# Patient Record
Sex: Female | Born: 1985 | Race: White | Hispanic: No | Marital: Single | Smoking: Current every day smoker
Health system: Southern US, Community
[De-identification: ages and names within clinical notes are randomized; demographics above are authoritative.]

## PROBLEM LIST (undated history)

## (undated) DIAGNOSIS — F419 Anxiety disorder, unspecified: Secondary | ICD-10-CM

## (undated) DIAGNOSIS — F32A Depression, unspecified: Secondary | ICD-10-CM

## (undated) DIAGNOSIS — F329 Major depressive disorder, single episode, unspecified: Secondary | ICD-10-CM

## (undated) HISTORY — PX: ADENOIDECTOMY: SUR15

---

## 2009-04-08 ENCOUNTER — Emergency Department (HOSPITAL_BASED_OUTPATIENT_CLINIC_OR_DEPARTMENT_OTHER): Admission: EM | Admit: 2009-04-08 | Discharge: 2009-04-08 | Payer: Self-pay | Admitting: Emergency Medicine

## 2010-08-22 LAB — D-DIMER, QUANTITATIVE: D-Dimer, Quant: <0.22 ug/mL-FEU (ref 0.00–0.48)

## 2010-08-22 LAB — BASIC METABOLIC PANEL
BUN: 13 mg/dL (ref 6–23)
Creatinine, Ser: 0.7 mg/dL (ref 0.4–1.2)
GFR calc non Af Amer: >60 mL/min (ref >60)
Glucose, Bld: 85 mg/dL (ref 70–99)
Potassium: 4.9 mEq/L (ref 3.5–5.1)

## 2014-07-22 ENCOUNTER — Emergency Department (HOSPITAL_COMMUNITY): Payer: Self-pay

## 2014-07-22 ENCOUNTER — Emergency Department (HOSPITAL_COMMUNITY)
Admission: EM | Admit: 2014-07-22 | Discharge: 2014-07-22 | Disposition: A | Discharge status: Home / Self Care | Payer: Self-pay | Attending: Emergency Medicine | Admitting: Emergency Medicine

## 2014-07-22 ENCOUNTER — Encounter (HOSPITAL_COMMUNITY): Payer: Self-pay | Admitting: Emergency Medicine

## 2014-07-22 DIAGNOSIS — F419 Anxiety disorder, unspecified: Secondary | ICD-10-CM | POA: Insufficient documentation

## 2014-07-22 DIAGNOSIS — Z720 Tobacco use: Secondary | ICD-10-CM | POA: Insufficient documentation

## 2014-07-22 DIAGNOSIS — J209 Acute bronchitis, unspecified: Secondary | ICD-10-CM | POA: Insufficient documentation

## 2014-07-22 DIAGNOSIS — Z79899 Other long term (current) drug therapy: Secondary | ICD-10-CM | POA: Insufficient documentation

## 2014-07-22 DIAGNOSIS — F329 Major depressive disorder, single episode, unspecified: Secondary | ICD-10-CM | POA: Insufficient documentation

## 2014-07-22 HISTORY — DX: Major depressive disorder, single episode, unspecified: F32.9

## 2014-07-22 HISTORY — DX: Anxiety disorder, unspecified: F41.9

## 2014-07-22 HISTORY — DX: Depression, unspecified: F32.A

## 2014-07-22 MED ORDER — ALBUTEROL SULFATE HFA 108 (90 BASE) MCG/ACT IN AERS
2.0000 | INHALATION_SPRAY | RESPIRATORY_TRACT | Status: DC
  Administered 2014-07-22: 2 via RESPIRATORY_TRACT
  Filled 2014-07-22: qty 6.7

## 2014-07-22 MED ORDER — AZITHROMYCIN 250 MG PO TABS: ORAL_TABLET | ORAL | Status: DC

## 2014-07-22 MED ORDER — BENZONATATE 100 MG PO CAPS: 200.0000 mg | ORAL_CAPSULE | Freq: Three times a day (TID) | ORAL | Status: DC

## 2014-07-22 NOTE — ED Notes (Signed)
Patient complaining of cough and congestion x 6 days.

## 2014-07-22 NOTE — ED Notes (Addendum)
Alert, talking, sick since Sunday, Cough, fever,green sputum.  Nasal congestion

## 2014-07-22 NOTE — Discharge Instructions (Signed)
Acute Bronchitis °Bronchitis is inflammation of the airways that extend from the windpipe into the lungs (bronchi). The inflammation often causes mucus to develop. This leads to a cough, which is the most common symptom of bronchitis.  °In acute bronchitis, the condition usually develops suddenly and goes away over time, usually in a couple weeks. Smoking, allergies, and asthma can make bronchitis worse. Repeated episodes of bronchitis may cause further lung problems.  °CAUSES °Acute bronchitis is most often caused by the same virus that causes a cold. The virus can spread from person to person (contagious) through coughing, sneezing, and touching contaminated objects. °SIGNS AND SYMPTOMS  °· Cough.   °· Fever.   °· Coughing up mucus.   °· Body aches.   °· Chest congestion.   °· Chills.   °· Shortness of breath.   °· Sore throat.   °DIAGNOSIS  °Acute bronchitis is usually diagnosed through a physical exam. Your health care provider will also ask you questions about your medical history. Tests, such as chest X-rays, are sometimes done to rule out other conditions.  °TREATMENT  °Acute bronchitis usually goes away in a couple weeks. Oftentimes, no medical treatment is necessary. Medicines are sometimes given for relief of fever or cough. Antibiotic medicines are usually not needed but may be prescribed in certain situations. In some cases, an inhaler may be recommended to help reduce shortness of breath and control the cough. A cool mist vaporizer may also be used to help thin bronchial secretions and make it easier to clear the chest.  °HOME CARE INSTRUCTIONS °· Get plenty of rest.   °· Drink enough fluids to keep your urine clear or pale yellow (unless you have a medical condition that requires fluid restriction). Increasing fluids may help thin your respiratory secretions (sputum) and reduce chest congestion, and it will prevent dehydration.   °· Take medicines only as directed by your health care provider. °· If  you were prescribed an antibiotic medicine, finish it all even if you start to feel better. °· Avoid smoking and secondhand smoke. Exposure to cigarette smoke or irritating chemicals will make bronchitis worse. If you are a smoker, consider using nicotine gum or skin patches to help control withdrawal symptoms. Quitting smoking will help your lungs heal faster.   °· Reduce the chances of another bout of acute bronchitis by washing your hands frequently, avoiding people with cold symptoms, and trying not to touch your hands to your mouth, nose, or eyes.   °· Keep all follow-up visits as directed by your health care provider.   °SEEK MEDICAL CARE IF: °Your symptoms do not improve after 1 week of treatment.  °SEEK IMMEDIATE MEDICAL CARE IF: °· You develop an increased fever or chills.   °· You have chest pain.   °· You have severe shortness of breath. °· You have bloody sputum.   °· You develop dehydration. °· You faint or repeatedly feel like you are going to pass out. °· You develop repeated vomiting. °· You develop a severe headache. °MAKE SURE YOU:  °· Understand these instructions. °· Will watch your condition. °· Will get help right away if you are not doing well or get worse. °Document Released: 06/13/2004 Document Revised: 09/20/2013 Document Reviewed: 10/27/2012 °ExitCare® Patient Information ©2015 [employer]. This information is not intended to replace advice given to you by your health care provider. Make sure you discuss any questions you have with your health care provider. ° ° °Emergency Department Resource Guide °1) Find a Doctor and Pay Out of Pocket °Although you won't have to find out who is   covered by your insurance plan, it is a Alvira idea to ask around and get recommendations. You will then need to call the office and see if the doctor you have chosen will accept you as a new patient and what types of options they offer for patients who are self-pay. Some doctors offer discounts or will set up  payment plans for their patients who do not have insurance, but you will need to ask so you aren't surprised when you get to your appointment. ° °2) Contact Your Local Health Department °Not all health departments have doctors that can see patients for sick visits, but many do, so it is worth a call to see if yours does. If you don't know where your local health department is, you can check in your phone book. The CDC also has a tool to help you locate your state's health department, and many state websites also have listings of all of their local health departments. ° °3) Find a Walk-in Clinic °If your illness is not likely to be very severe or complicated, you may want to try a walk in clinic. These are popping up all over the country in pharmacies, drugstores, and shopping centers. They're usually staffed by nurse practitioners or physician assistants that have been trained to treat common illnesses and complaints. They're usually fairly quick and inexpensive. However, if you have serious medical issues or chronic medical problems, these are probably not your best option. ° °No Primary Care Doctor: °- Call Health Connect at  832-8000 - they can help you locate a primary care doctor that  accepts your insurance, provides certain services, etc. °- Physician Referral Service- 1-800-533-3463 ° °Chronic Pain Problems: °Organization         Address  Phone   Notes  °[facility]  ([phone]) [phone] Patients need to be referred by their primary care doctor.  ° °Medication Assistance: °Organization         Address  Phone   Notes  °[place] [facility] [address] °[place], [place] [address] (336) 641-8030 --Must be a resident of [place] °-- Must have NO insurance coverage whatsoever (no Medicaid/ Medicare, etc.) °-- The pt. MUST have a primary care doctor that directs their care regularly and follows them in the community °  °[facility]  (866) 331-1348   °[employer]  (888) 892-1162   ° °Agencies that provide inexpensive medical care: °Organization         Address  Phone   Notes  °[facility]  (336) 832-8035   ° Internal Medicine    (336) 832-7272   °[facility] [address] °[place], [place] [address] (336) 832-4777   °[facility] [address], °[place] (336) 271-4999   °Planned Parenthood    (336) 373-0678   °[facility]    (336) 272-1050   °[facility] ° [address], [place] Phone:  (336) 832-4444, Fax:  (336) 832-4440 Hours of Operation:  9 am - 6 pm, M-F.  Also accepts Medicaid/Medicare and self-pay.  °[facility] ° [address], [place] Phone: (336) [phone], Fax: (336) [phone]. Hours of Operation:  8:30 am - 5:30 pm, M-F.  Also accepts Medicaid and self-pay.  °[facility] [place] [address], [place] Phone: (336) [phone]   °[facility] [address], [place], [place] (336)723-1848, Ext.   123 Mondays & Thursdays: 7-9 AM.  First 15 patients are seen on a first come, first serve basis.    [place] Providers:  Organization         Address  Phone   Notes  [facility] [address], [place] [phone] Also accepts self-pay patients.  [facility] [address], [place]  [phone]   [place], [address], [place] [phone]   [facility] [address] [place] [phone]   Lucianne Lei [address], [place]   [phone] Only accepts [place] Access [place] patients after they have their name applied to their card.   Self-Pay (no insurance) in [facility]:  Organization         Address  Phone   Notes  Sickle Cell Patients, [facility] [place]  [phone]   [facility] [place] [phone]   [facility] [place]  [place], [place], [place] [phone]   [facility]/Dr. Osei-Bonsu  [address], [place] or [address], [place] [phone] Phone number for both [place] and [place] locations is the same.  [facility] [address] [place] [phone]   [facility] [address], [place] or [address] [phone]   [facility] [address], [place] [phone], phone; [phone], fax Sees patients 1st and 3rd Saturday of every month.  Must not qualify for public or private insurance (i.e. Medicaid, Medicare, [place] Health Choice, Veterans' Benefits)  Household income should be no more than 200% of the poverty level The clinic cannot treat you if you are pregnant or think you are pregnant  Sexually transmitted diseases are not treated at the clinic.    Dental Care: Organization         Address  Phone  Notes  [facility] [place] [phone] Accepts children up to age 24 who are enrolled in [place] or [place]; pregnant women with a Medicaid card; and children who have applied for Medicaid or [place] Health Choice, but were declined, whose parents can pay a reduced fee at time of service.  [facility]  [address], [place] [phone] Accepts children up to age 75 who are enrolled in [place] or [place]; pregnant women with a Medicaid card; and children who have applied for Medicaid or [place] Health Choice, but were declined, whose parents can pay a reduced fee at time of service.  [facility]  [place] [phone] Patients  are seen by appointment only. Walk-ins are not accepted. [place] will see patients 73 years of age and older. Monday - Tuesday (8am-5pm) Most Wednesdays (8:30-5pm) $30 per visit, cash only  [facility]  [address], [facility] [phone] Patients are seen by appointment only. Walk-ins are not accepted. [place] will see patients 100 years of age and older. One Wednesday Evening (Monthly: Volunteer Based).  $30 per visit, cash only  [place]  [phone] for  adults; Children under age 4, call Graduate Pediatric Dentistry at [phone]. Children aged 4-14, please call [phone] to request a pediatric application. ° Dental services are provided in all areas of dental care including fillings, crowns and bridges, complete and partial dentures, implants, gum treatment, root canals, and extractions. Preventive care is also provided. Treatment is provided to both adults and children. °Patients are selected via a lottery and there is often a waiting list. °  °[facility] [address], °[place] ° (336) 763-8833 [email or web address] °  °[facility] [address], [place], [place] (336)723-1848, Ext. 123 Second and Fourth Thursday of each month, opens at 6:30 AM; Clinic ends at 9 AM.  Patients are seen on a first-come first-served basis, and a limited number are seen during each clinic.  ° °[facility] ° [address], [place], [place] (336) [phone]   Eligibility Requirements °You must have lived in [place], [place], or [place] counties for at least the last three months. °  You cannot be eligible for state or federal sponsored healthcare insurance, including Veterans Administration, Medicaid, or Medicare. °  You generally cannot be eligible for healthcare insurance through your employer.  °  How to apply: °Eligibility screenings are held every Tuesday and Wednesday afternoon from 1:00 pm until  4:00 pm. You do not need an appointment for the interview!  °[facility] [address], [place], [place] [phone]   °[place] [facility]  336-342-8273   °[place]  336-703-3100   °[facility]  336-570-6415   ° °Behavioral Health Resources in the Community: °Intensive Outpatient Programs °Organization         Address  Phone  Notes  °[facility] [address], [place], [place] [phone]   °[facility] [address], [place], [place] [phone]   °[facility] [address], [place], [place] ° 336-882-2125   °[facility] [address],  °[place] 1-800-853-5163 or 336-641-4981   °Substance Abuse Resources °Organization         Address  Phone  Notes  °[facility]  336-882-2125   °[facility]  336-784-9470   °[facility]  336-285-9073   °[facility]  336-845-3988   °Residential & Outpatient Substance Abuse Program  1-800-659-3381   °Psychological Services °Organization         Address  Phone  Notes  °[facility]  336- 832-9600   °[facility]  336- 378-7881   °[facility] [address], [place] 1-800-853-5163 or 336-641-4981   ° °Mobile Crisis Teams °Organization         Address  Phone  Notes  °[facility], Mobile Crisis Care Unit  1-877-626-1772   °Assertive °[facility]otherapeutic Services ° [address] [place], [place] 336-834-9664   °Sharon DeEsch [address] °[place] 336-554-5454   ° °Self-Help/Support Groups °Organization         Address  Phone             Notes  °[facility] - variety of support groups  336- 373-1402 Call for more information  °Narcotics Anonymous (NA), [facility] [address], °[place]  2 meetings at this location  ° °Residential Treatment Programs °Organization          Address  Phone  Notes  °[facility]   [facility] [address],    [place]  [phone]   [facility]  [address], [place] [phone], [place], [place] [phone]   [facility] [address], [place] [phone] Admissions: 8am-3pm M-F  [facility] [address]    [place], [place] [phone]   [facility] [address], [place], [place] [phone]   [facility] [address]  [place], [place] [phone]   [facility] Intensive Outpatient [address], [place], [place] [phone]   [facility] ([facility] [address]  [place], [place] [phone] or [phone]   [facility] ([facility]) [address] [place], [place] [phone] Accepts Medicaid  [facility] [address]  [place] [phone] Substance Abuse/Addiction Treatment   [facility] Resources Organization         Address  Phone  Notes  [facility]  ([phone]) [phone]   Angie FavaJulie Brannon, PhD [address], [address] [place], [place]   [phone](336) [phone] or [phone](336) [phone]   [facility]   [address] [place], [place] [phone](336) [phone]   [facility] [address], [place], [place] [phone](336) [phone] Insurance/Medicaid/sponsorship through [facility] [address] [place] [address]                                    [place], [place] [phone](336) [phone] Therapy/tele-psych/case  [facility] [address]   [place], [place] [phone]) [phone]    Dr. Lolly MustacheArfeen  [phone]) [phone]   [facility] [place]  [employer] [facility] 1) [address],  2) [address], [place] 3)  [address], [place] [phone](336) [phone]  [phone](336) [phone]   [facility] Child Abuse Hot[facility] [phone](336) [phone] or [phone](336) [phone] (After Hours)         Use the medicines as prescribed.   Take your inhaler using the spacer as instructed - 2 puffs every 4 hours if you are wheezing or coughing.  Get rechecked if you develop any worsening symptoms such as shortness of breath, fevers.  Consider smoking cessation.

## 2014-07-24 NOTE — ED Provider Notes (Signed)
CSN: [id number]     Arrival date & time 07/22/14  [id number] History   First MD Initiated Contact with Patient 07/22/14 1106     Chief Complaint  Patient presents with  . Cough  . Nasal Congestion     (Consider location/radiation/quality/duration/timing/severity/associated sxs/prior Treatment) Patient is a 29 y.o. female presenting with cough. The history is provided by the patient.  Cough Cough characteristics:  Productive Sputum characteristics:  Green Severity:  Moderate Onset quality:  Gradual Duration:  6 days Timing:  Intermittent Progression:  Worsening Chronicity:  New Smoker: yes   Context: upper respiratory infection   Relieved by:  Nothing Worsened by:  Nothing tried Ineffective treatments: mucinex and alka seltzer plus. Associated symptoms: chest pain, chills, fever, rhinorrhea, shortness of breath and wheezing   Associated symptoms: no sinus congestion and no sore throat   Associated symptoms comment:  Burning chest pain with cough only. Fever subjective.  Chest pain:    Quality:  Burning   Severity:  Moderate   Timing:  Intermittent   Past Medical History  Diagnosis Date  . Depression   . Anxiety    History reviewed. No pertinent past surgical history. History reviewed. No pertinent family history. History  Substance Use Topics  . Smoking status: Current Every Day Smoker  . Smokeless tobacco: Not on file  . Alcohol Use: No   OB History    No data available     Review of Systems  Constitutional: Positive for fever and chills.  HENT: Positive for rhinorrhea. Negative for congestion and sore throat.   Respiratory: Positive for cough, shortness of breath and wheezing.   Cardiovascular: Positive for chest pain. Negative for palpitations and leg swelling.  Gastrointestinal: Negative for abdominal pain.  Genitourinary: Negative.       Allergies  Review of patient's allergies indicates no known allergies.  Home Medications   Prior to Admission  medications   Medication Sig Start Date End Date Taking? Authorizing Provider  citalopram (CELEXA) 20 MG tablet Take 20 mg by mouth daily.   Yes Historical Provider, MD  guaiFENesin (MUCINEX) 600 MG 12 hr tablet Take 1,200 mg by mouth 2 (two) times daily.   Yes Historical Provider, MD  hydrOXYzine (ATARAX/VISTARIL) 50 MG tablet Take 50 mg by mouth 3 (three) times daily.   Yes Historical Provider, MD  Phenyleph-Doxylamine-DM-APAP (ALKA SELTZER PLUS PO) Take 2 capsules by mouth every 6 (six) hours as needed (congestion/cough).   Yes Historical Provider, MD  traZODone (DESYREL) 50 MG tablet Take 50 mg by mouth at bedtime.   Yes Historical Provider, MD  azithromycin (ZITHROMAX Z-PAK) 250 MG tablet Take 2 tablets by mouth on day one followed by one tablet daily for 4 days. 07/22/14   Burgess AmorJulie Lajeana Strough, PA-C  benzonatate (TESSALON) 100 MG capsule Take 2 capsules (200 mg total) by mouth every 8 (eight) hours. 07/22/14   Burgess AmorJulie Levii [place]field, PA-C   BP 136/75 mmHg  Pulse 78  Temp(Src) 98.5 F (36.9 C) (Oral)  Resp 16  Ht 5\' 6"  (1.676 m)  Wt 200 lb (90.719 kg)  BMI 32.30 kg/m2  SpO2 96%  LMP 07/15/2014 Physical Exam  Constitutional: She is oriented to person, place, and time. She appears well-developed and well-nourished.  HENT:  Head: Normocephalic and atraumatic.  Right Ear: Tympanic membrane and ear canal normal.  Left Ear: Tympanic membrane and ear canal normal.  Nose: Rhinorrhea present. No mucosal edema.  Mouth/Throat: Uvula is midline, oropharynx is clear and moist and mucous membranes are normal.  No oropharyngeal exudate, posterior oropharyngeal edema, posterior oropharyngeal erythema or tonsillar abscesses.  Eyes: Conjunctivae are normal.  Cardiovascular: Normal rate and normal heart sounds.   Pulmonary/Chest: Effort normal. No respiratory distress. She has no decreased breath sounds. She has no wheezes. She has no rhonchi. She has no rales.  Coarse breath sounds.  Abdominal: Soft. There is no tenderness.   Musculoskeletal: Normal range of motion.  Neurological: She is alert and oriented to person, place, and time.  Skin: Skin is warm and dry. No rash noted.  Psychiatric: She has a normal mood and affect.    ED Course  Procedures (including critical care time) Labs Review Labs Reviewed - No data to display  Imaging Review Dg Chest 2 View  07/22/2014   CLINICAL DATA:  Cough and fever for 5 days  EXAM: CHEST  2 VIEW  COMPARISON:  None.  FINDINGS: Lungs are clear. Heart size and pulmonary vascularity are normal. No adenopathy. No bone lesions.  IMPRESSION: No edema or consolidation.   Electronically Signed   By: Bretta Bang III M.D.   On: 07/22/2014 10:09     EKG Interpretation None      MDM   Final diagnoses:  Acute bronchitis, unspecified organism    Patients labs and/or radiological studies were reviewed and considered during the medical decision making and disposition process.  Results were also discussed with patient. Pt placed on z pack, tessalon, given albuterol mdi. Encouraged rest, motrin or tylenol prn fever, advised smoking cessation. Recheck for any worsened sx.  The patient appears reasonably screened and/or stabilized for discharge and I doubt any other medical condition or other [facility] requiring further screening, evaluation, or treatment in the ED at this time prior to discharge.     Burgess Amor, PA-C 07/24/14 9604  Donnetta Hutching, MD 07/30/14 [phone]

## 2014-10-19 ENCOUNTER — Encounter (HOSPITAL_COMMUNITY): Payer: Self-pay | Admitting: *Deleted

## 2014-10-19 ENCOUNTER — Emergency Department (HOSPITAL_COMMUNITY)
Admission: EM | Admit: 2014-10-19 | Discharge: 2014-10-20 | Disposition: A | Discharge status: Home / Self Care | Payer: Self-pay | Attending: Emergency Medicine | Admitting: Emergency Medicine

## 2014-10-19 DIAGNOSIS — K029 Dental caries, unspecified: Secondary | ICD-10-CM | POA: Insufficient documentation

## 2014-10-19 DIAGNOSIS — K0889 Other specified disorders of teeth and supporting structures: Secondary | ICD-10-CM

## 2014-10-19 DIAGNOSIS — Z79899 Other long term (current) drug therapy: Secondary | ICD-10-CM | POA: Insufficient documentation

## 2014-10-19 DIAGNOSIS — K088 Other specified disorders of teeth and supporting structures: Secondary | ICD-10-CM | POA: Insufficient documentation

## 2014-10-19 DIAGNOSIS — Z792 Long term (current) use of antibiotics: Secondary | ICD-10-CM | POA: Insufficient documentation

## 2014-10-19 DIAGNOSIS — F329 Major depressive disorder, single episode, unspecified: Secondary | ICD-10-CM | POA: Insufficient documentation

## 2014-10-19 DIAGNOSIS — F419 Anxiety disorder, unspecified: Secondary | ICD-10-CM | POA: Insufficient documentation

## 2014-10-19 MED ORDER — AMOXICILLIN 250 MG PO CAPS
500.0000 mg | ORAL_CAPSULE | Freq: Once | ORAL | Status: AC
  Administered 2014-10-20: 500 mg via ORAL
  Filled 2014-10-19: qty 2

## 2014-10-19 MED ORDER — ACETAMINOPHEN-CODEINE #3 300-30 MG PO TABS: 2.0000 | ORAL_TABLET | Freq: Once | ORAL | Status: DC

## 2014-10-19 MED ORDER — ACETAMINOPHEN 325 MG PO TABS
650.0000 mg | ORAL_TABLET | Freq: Once | ORAL | Status: AC
  Administered 2014-10-20: 650 mg via ORAL
  Filled 2014-10-19: qty 2

## 2014-10-19 MED ORDER — AMOXICILLIN 500 MG PO CAPS: 500.0000 mg | ORAL_CAPSULE | Freq: Three times a day (TID) | ORAL | Status: DC

## 2014-10-19 MED ORDER — IBUPROFEN 800 MG PO TABS: 800.0000 mg | ORAL_TABLET | Freq: Three times a day (TID) | ORAL | Status: DC

## 2014-10-19 MED ORDER — IBUPROFEN 800 MG PO TABS
800.0000 mg | ORAL_TABLET | Freq: Once | ORAL | Status: AC
  Administered 2014-10-20: 800 mg via ORAL
  Filled 2014-10-19: qty 1

## 2014-10-19 NOTE — ED Notes (Signed)
Pt co upper left jaw pain from cracked tooth, pt has dental appt in 2 weeks but states needs help tonight.

## 2014-10-19 NOTE — Discharge Instructions (Signed)
Dental Pain  Toothache is pain in or around a tooth. It may get worse with chewing or with cold or heat.   HOME CARE  · Your dentist may use a numbing medicine during treatment. If so, you may need to avoid eating until the medicine wears off. Ask your dentist about this.  · Only take medicine as told by your dentist or doctor.  · Avoid chewing food near the painful tooth until after all treatment is done. Ask your dentist about this.  GET HELP RIGHT AWAY IF:   · The problem gets worse or new problems appear.  · You have a fever.  · There is redness and puffiness (swelling) of the face, jaw, or neck.  · You cannot open your mouth.  · There is pain in the jaw.  · There is very bad pain that is not helped by medicine.  MAKE SURE YOU:   · Understand these instructions.  · Will watch your condition.  · Will get help right away if you are not doing well or get worse.  Document Released: 10/23/2007 Document Revised: 07/29/2011 Document Reviewed: 10/23/2007  ExitCare® Patient Information ©2015 [employer]. This information is not intended to replace advice given to you by your health care provider. Make sure you discuss any questions you have with your health care provider.

## 2014-10-19 NOTE — ED Provider Notes (Signed)
CSN: [id number]     Arrival date & time 10/19/14  2238 History   First MD Initiated Contact with Patient 10/19/14 2310     Chief Complaint  Patient presents with  . Dental Pain     (Consider location/radiation/quality/duration/timing/severity/associated sxs/prior Treatment) Patient is a 29 y.o. female presenting with tooth pain. The history is provided by the patient.  Dental Pain Location:  Upper Quality:  Throbbing and shooting Severity:  Moderate Onset quality:  Gradual Timing:  Intermittent Progression:  Worsening Context: dental caries   Relieved by:  Nothing Worsened by:  Cold food/drink Associated symptoms: facial pain   Associated symptoms: no drooling and no fever   Risk factors: smoking   Risk factors: no diabetes     Past Medical History  Diagnosis Date  . Depression   . Anxiety    History reviewed. No pertinent past surgical history. History reviewed. No pertinent family history. History  Substance Use Topics  . Smoking status: Current Every Day Smoker  . Smokeless tobacco: Not on file  . Alcohol Use: No   OB History    No data available     Review of Systems  Constitutional: Negative for fever.  HENT: Positive for dental problem. Negative for drooling.   Psychiatric/Behavioral: The patient is nervous/anxious.   All other systems reviewed and are negative.     Allergies  Review of patient's allergies indicates no known allergies.  Home Medications   Prior to Admission medications   Medication Sig Start Date End Date Taking? Authorizing Provider  azithromycin (ZITHROMAX Z-PAK) 250 MG tablet Take 2 tablets by mouth on day one followed by one tablet daily for 4 days. 07/22/14   Burgess Amor, PA-C  benzonatate (TESSALON) 100 MG capsule Take 2 capsules (200 mg total) by mouth every 8 (eight) hours. 07/22/14   Burgess Amor, PA-C  citalopram (CELEXA) 20 MG tablet Take 20 mg by mouth daily.    Historical Provider, MD  guaiFENesin (MUCINEX) 600 MG 12 hr tablet  Take 1,200 mg by mouth 2 (two) times daily.    Historical Provider, MD  hydrOXYzine (ATARAX/VISTARIL) 50 MG tablet Take 50 mg by mouth 3 (three) times daily.    Historical Provider, MD  Phenyleph-Doxylamine-DM-APAP (ALKA SELTZER PLUS PO) Take 2 capsules by mouth every 6 (six) hours as needed (congestion/cough).    Historical Provider, MD  traZODone (DESYREL) 50 MG tablet Take 50 mg by mouth at bedtime.    Historical Provider, MD   BP 148/84 mmHg  Pulse 66  Temp(Src) 98 F (36.7 C) (Oral)  Resp 17  Ht  (1.676 m)  Wt 200 lb (90.719 kg)  BMI 32.30 kg/m2  SpO2 97%  LMP 09/07/2014 Physical Exam  Constitutional: She is oriented to person, place, and time. She appears well-developed and well-nourished.  Non-toxic appearance.  HENT:  Head: Normocephalic.  Right Ear: Tympanic membrane and external ear normal.  Left Ear: Tympanic membrane and external ear normal.  Mouth/Throat: Uvula is midline. No trismus in the jaw. Abnormal dentition. Dental caries present. No dental abscesses.    Eyes: EOM and lids are normal. Pupils are equal, round, and reactive to light.  Neck: Normal range of motion. Neck supple. Carotid bruit is not present.  Cardiovascular: Normal rate, regular rhythm, normal heart sounds, intact distal pulses and normal pulses.   Pulmonary/Chest: Breath sounds normal. No respiratory distress.  Abdominal: Soft. Bowel sounds are normal. There is no tenderness. There is no guarding.  Musculoskeletal: Normal range of motion.  Lymphadenopathy:       Head (right side): No submandibular adenopathy present.       Head (left side): No submandibular adenopathy present.    She has no cervical adenopathy.  Neurological: She is alert and oriented to person, place, and time. She has normal strength. No cranial nerve deficit or sensory deficit.  Skin: Skin is warm and dry.  Psychiatric: She has a normal mood and affect. Her speech is normal.  Nursing note and vitals reviewed.   ED  Course  Procedures (including critical care time) Labs Review Labs Reviewed - No data to display  Imaging Review No results found.   EKG Interpretation None      MDM Vital signs stable. No abscess. No evidence for Ludwig's angina. Rx for amoxil and ibuprofen given to the patient.   Final diagnoses:  None    **I have reviewed nursing notes, vital signs, and all appropriate lab and imaging results for this patient.Ivery Quale*    Maelle Sheaffer, PA-C 10/20/14 0002  Devoria AlbeIva Knapp, MD 10/20/14 [phone]

## 2014-11-28 ENCOUNTER — Emergency Department (HOSPITAL_COMMUNITY): Payer: Self-pay

## 2014-11-28 ENCOUNTER — Emergency Department (HOSPITAL_COMMUNITY)
Admission: EM | Admit: 2014-11-28 | Discharge: 2014-11-28 | Disposition: A | Discharge status: Home / Self Care | Payer: Worker's Compensation | Attending: Emergency Medicine | Admitting: Emergency Medicine

## 2014-11-28 ENCOUNTER — Encounter (HOSPITAL_COMMUNITY): Payer: Self-pay | Admitting: Emergency Medicine

## 2014-11-28 DIAGNOSIS — F419 Anxiety disorder, unspecified: Secondary | ICD-10-CM | POA: Insufficient documentation

## 2014-11-28 DIAGNOSIS — X58XXXA Exposure to other specified factors, initial encounter: Secondary | ICD-10-CM | POA: Insufficient documentation

## 2014-11-28 DIAGNOSIS — Y9289 Other specified places as the place of occurrence of the external cause: Secondary | ICD-10-CM | POA: Insufficient documentation

## 2014-11-28 DIAGNOSIS — Y9389 Activity, other specified: Secondary | ICD-10-CM | POA: Insufficient documentation

## 2014-11-28 DIAGNOSIS — F329 Major depressive disorder, single episode, unspecified: Secondary | ICD-10-CM | POA: Insufficient documentation

## 2014-11-28 DIAGNOSIS — S63502A Unspecified sprain of left wrist, initial encounter: Secondary | ICD-10-CM

## 2014-11-28 DIAGNOSIS — Y990 Civilian activity done for income or pay: Secondary | ICD-10-CM | POA: Insufficient documentation

## 2014-11-28 DIAGNOSIS — Z79899 Other long term (current) drug therapy: Secondary | ICD-10-CM | POA: Insufficient documentation

## 2014-11-28 DIAGNOSIS — Z720 Tobacco use: Secondary | ICD-10-CM | POA: Insufficient documentation

## 2014-11-28 MED ORDER — IBUPROFEN 800 MG PO TABS
800.0000 mg | ORAL_TABLET | Freq: Once | ORAL | Status: AC
  Administered 2014-11-28: 800 mg via ORAL
  Filled 2014-11-28: qty 1

## 2014-11-28 MED ORDER — IBUPROFEN 800 MG PO TABS: 800.0000 mg | ORAL_TABLET | Freq: Three times a day (TID) | ORAL | Status: DC

## 2014-11-28 NOTE — ED Notes (Signed)
Pt states that she twisted her left wrist at work.  Workman's comp injury

## 2014-11-28 NOTE — ED Provider Notes (Signed)
CSN: [id number]     Arrival date & time 11/28/14  1805 History   This chart was scribed for non-physician practitioner Pauline Ausammy Chelse Matas, PA-C working with Glynn OctaveStephen Rancour, MD by Murriel HopperAlec Bankhead, ED Scribe. This patient was seen in room APFT22/APFT22 and the patient's care was started at 7:12 PM.   Chief Complaint  Patient presents with  . Wrist Pain      Patient is a 29 y.o. female presenting with wrist pain. The history is provided by the patient. No language interpreter was used.  Wrist Pain This is a new problem. The current episode started 3 to 5 hours ago. The problem occurs constantly. The problem has not changed since onset.The symptoms are aggravated by stress and exertion. Nothing relieves the symptoms. She has tried nothing for the symptoms.     HPI Comments: Jamie Avery is a 29 y.o. female who presents to the Emergency Department complaining of constant left wrist pain with associated radiating pain through her left elbow that has been present since 4 hours PTA when pt injured her wrist at work. Pt states that she was moving boxes, and when she turned her wrist to place a box on a shelf that she felt a sharp pain to the lateral aspect of it. Pt notes now that any movement of her wrist causes pain, and states she has kept her wrist in a stable position ever since incident occurred. She denies numbness or weakness of the fingers, swelling or open wounds.  She has not tried any therapies prior to arrival  Past Medical History  Diagnosis Date  . Depression   . Anxiety    History reviewed. No pertinent past surgical history. History reviewed. No pertinent family history. History  Substance Use Topics  . Smoking status: Current Every Day Smoker  . Smokeless tobacco: Not on file  . Alcohol Use: No   OB History    No data available     Review of Systems  Musculoskeletal: Positive for myalgias and arthralgias. Negative for joint swelling.  Neurological: Negative for weakness and  numbness.  All other systems reviewed and are negative.     Allergies  Review of patient's allergies indicates no known allergies.  Home Medications   Prior to Admission medications   Medication Sig Start Date End Date Taking? Authorizing Provider  amoxicillin (AMOXIL) 500 MG capsule Take 1 capsule (500 mg total) by mouth 3 (three) times daily. 10/19/14   Ivery QualeHobson Bryant, PA-C  azithromycin (ZITHROMAX Z-PAK) 250 MG tablet Take 2 tablets by mouth on day one followed by one tablet daily for 4 days. 07/22/14   Burgess AmorJulie Idol, PA-C  benzonatate (TESSALON) 100 MG capsule Take 2 capsules (200 mg total) by mouth every 8 (eight) hours. 07/22/14   Burgess AmorJulie Idol, PA-C  citalopram (CELEXA) 20 MG tablet Take 20 mg by mouth daily.    Historical Provider, MD  guaiFENesin (MUCINEX) 600 MG 12 hr tablet Take 1,200 mg by mouth 2 (two) times daily.    Historical Provider, MD  hydrOXYzine (ATARAX/VISTARIL) 50 MG tablet Take 50 mg by mouth 3 (three) times daily.    Historical Provider, MD  ibuprofen (ADVIL,MOTRIN) 800 MG tablet Take 1 tablet (800 mg total) by mouth 3 (three) times daily. 10/19/14   Ivery QualeHobson Bryant, PA-C  Phenyleph-Doxylamine-DM-APAP (ALKA SELTZER PLUS PO) Take 2 capsules by mouth every 6 (six) hours as needed (congestion/cough).    Historical Provider, MD  traZODone (DESYREL) 50 MG tablet Take 50 mg by mouth at bedtime.  Historical Provider, MD   BP 143/86 mmHg  Pulse 75  Temp(Src) 98.5 F (36.9 C) (Oral)  Resp 24  Ht  (1.676 m)  Wt 211 lb (95.709 kg)  BMI 34.07 kg/m2  SpO2 98% Physical Exam  Constitutional: She is oriented to person, place, and time. She appears well-developed and well-nourished.  HENT:  Head: Normocephalic and atraumatic.  Cardiovascular: Normal rate.   Pulmonary/Chest: Effort normal.  Musculoskeletal: She exhibits no edema.  Tenderness at distal left wrist No edema No bony deformity Sensation and radial pulse intact   Neurological: She is alert and oriented to person,  place, and time.  Skin: Skin is warm and dry.  Psychiatric: She has a normal mood and affect.  Nursing note and vitals reviewed.   ED Course  Procedures (including critical care time)  DIAGNOSTIC STUDIES: Oxygen Saturation is 98% on room air, normal by my interpretation.    COORDINATION OF CARE: 7:17 PM Discussed treatment plan with pt at bedside and pt agreed to plan.   Labs Review Labs Reviewed - No data to display  Imaging Review Dg Wrist Complete Left  11/28/2014   CLINICAL DATA:  29 year old female with trauma CT left wrist  EXAM: LEFT WRIST - COMPLETE 3+ VIEW  COMPARISON:  None.  FINDINGS: There is no evidence of fracture or dislocation. There is no evidence of arthropathy or other focal bone abnormality. Soft tissues are unremarkable.  IMPRESSION: No fracture or dislocation.   Electronically Signed   By: Elgie Collard M.D.   On: 11/28/2014 18:32     EKG Interpretation None      MDM   Final diagnoses:  Wrist sprain, left, initial encounter    Sprain of wrist, XR neg for fx.  NV intact.  Wrist splint applied, pain improved.  Agrees to ortho f/u in one week if needed  I personally performed the services described in this documentation, which was scribed in my presence. The recorded information has been reviewed and is accurate.    Pauline Aus, PA-C 12/01/14 1631  Glynn Octave, MD 12/02/14 [phone]

## 2014-11-28 NOTE — Discharge Instructions (Signed)
Ligament Sprain °A ligament sprain is when the bands of tissue that hold bones together (ligament) are stretched. °HOME CARE  °· Rest the injured area. °· Start using the joint when told to by your doctor. °· Keep the injured area raised (elevated) above the level of the heart. This may lessen puffiness (swelling). °· Put ice on the injured area. °¨ Put ice in a plastic bag. °¨ Place a towel between your skin and the bag. °¨ Leave the ice on for 15-20 minutes, 03-04 times a day. °· Wear a splint, cast, or an elastic bandage as told by your doctor. °· Only take medicine as told by your doctor. °· Use crutches as told by your doctor. Do not put weight on the injured joint until told to by your doctor. °GET HELP RIGHT AWAY IF:  °· You have more bruising, puffiness, or pain. °· The leg was injured and the toes are cold, tingling, numb, or blue. °· The arm was injured and the fingers are cold, tingling, numb, or blue. °· The pain is not helped with medicine. °· The pain gets worse. °MAKE SURE YOU:  °· Understand these instructions. °· Will watch this condition. °· Will get help right away if you are not doing well or get worse. °Document Released: 10/23/2007 Document Revised: 02/24/2013 Document Reviewed: 10/23/2007 °[employer]® Patient Information ©2015 [employer]. This information is not intended to replace advice given to you by your health care provider. Make sure you discuss any questions you have with your health care provider. ° °

## 2014-12-19 ENCOUNTER — Encounter (HOSPITAL_COMMUNITY): Payer: Self-pay

## 2014-12-19 ENCOUNTER — Emergency Department (HOSPITAL_COMMUNITY)
Admission: EM | Admit: 2014-12-19 | Discharge: 2014-12-19 | Disposition: A | Discharge status: Home / Self Care | Payer: Worker's Compensation | Attending: Emergency Medicine | Admitting: Emergency Medicine

## 2014-12-19 DIAGNOSIS — Z87828 Personal history of other (healed) physical injury and trauma: Secondary | ICD-10-CM | POA: Insufficient documentation

## 2014-12-19 DIAGNOSIS — F329 Major depressive disorder, single episode, unspecified: Secondary | ICD-10-CM | POA: Insufficient documentation

## 2014-12-19 DIAGNOSIS — M25532 Pain in left wrist: Secondary | ICD-10-CM | POA: Diagnosis present

## 2014-12-19 DIAGNOSIS — Z720 Tobacco use: Secondary | ICD-10-CM | POA: Insufficient documentation

## 2014-12-19 DIAGNOSIS — F419 Anxiety disorder, unspecified: Secondary | ICD-10-CM | POA: Diagnosis not present

## 2014-12-19 DIAGNOSIS — M654 Radial styloid tenosynovitis [de Quervain]: Secondary | ICD-10-CM

## 2014-12-19 DIAGNOSIS — Z79899 Other long term (current) drug therapy: Secondary | ICD-10-CM | POA: Insufficient documentation

## 2014-12-19 DIAGNOSIS — Z791 Long term (current) use of non-steroidal anti-inflammatories (NSAID): Secondary | ICD-10-CM | POA: Insufficient documentation

## 2014-12-19 NOTE — ED Notes (Signed)
Sprained left wrist at work 3 weeks ago, seen here day of injury, still having pain

## 2014-12-19 NOTE — ED Provider Notes (Signed)
CSN: [id number]     Arrival date & time 12/19/14  1407 History   First MD Initiated Contact with Patient 12/19/14 1506     Chief Complaint  Patient presents with  . Wrist Pain      HPI  Patient presents for valuation of left wrist pain. Seen here day of injury and had negative x-rays. She was reaching down with her hands supinated grasped a pallet. Lifted up to stand on edge and attempted to drag it to her left. She did not feel sudden pain or snap. It became painful within a few minutes in her left wrist. Has been wearing a simple volar wrist splint. Using naproxen and intermittently icing. Continuing pain.  Past Medical History  Diagnosis Date  . Depression   . Anxiety    History reviewed. No pertinent past surgical history. History reviewed. No pertinent family history. History  Substance Use Topics  . Smoking status: Current Every Day Smoker  . Smokeless tobacco: Not on file  . Alcohol Use: No   OB History    No data available     Review of Systems  Constitutional: Negative for fever, chills, diaphoresis, appetite change and fatigue.  HENT: Negative for mouth sores, sore throat and trouble swallowing.   Eyes: Negative for visual disturbance.  Respiratory: Negative for cough, chest tightness, shortness of breath and wheezing.   Cardiovascular: Negative for chest pain.  Gastrointestinal: Negative for nausea, vomiting, abdominal pain, diarrhea and abdominal distention.  Endocrine: Negative for polydipsia, polyphagia and polyuria.  Genitourinary: Negative for dysuria, frequency and hematuria.  Musculoskeletal: Negative for gait problem.       Pain along the left wrist.  Skin: Negative for color change, pallor and rash.  Neurological: Negative for dizziness, syncope, light-headedness and headaches.  Hematological: Does not bruise/bleed easily.  Psychiatric/Behavioral: Negative for behavioral problems and confusion.      Allergies  Review of patient's allergies  indicates no known allergies.  Home Medications   Prior to Admission medications   Medication Sig Start Date End Date Taking? Authorizing Provider  citalopram (CELEXA) 20 MG tablet Take 20 mg by mouth daily.   Yes Historical Provider, MD  naproxen (NAPROSYN) 500 MG tablet Take 500 mg by mouth 2 (two) times daily with a meal.   Yes Historical Provider, MD  traZODone (DESYREL) 50 MG tablet Take 100 mg by mouth at bedtime.    Yes Historical Provider, MD  amoxicillin (AMOXIL) 500 MG capsule Take 1 capsule (500 mg total) by mouth 3 (three) times daily. Patient not taking: Reported on 12/19/2014 10/19/14   Ivery Quale, PA-C  azithromycin (ZITHROMAX Z-PAK) 250 MG tablet Take 2 tablets by mouth on day one followed by one tablet daily for 4 days. Patient not taking: Reported on 12/19/2014 07/22/14   Burgess Amor, PA-C  benzonatate (TESSALON) 100 MG capsule Take 2 capsules (200 mg total) by mouth every 8 (eight) hours. Patient not taking: Reported on 12/19/2014 07/22/14   Burgess Amor, PA-C  ibuprofen (ADVIL,MOTRIN) 800 MG tablet Take 1 tablet (800 mg total) by mouth 3 (three) times daily. Patient not taking: Reported on 12/19/2014 11/28/14   Tammy Triplett, PA-C   BP 161/79 mmHg  Pulse 77  Temp(Src) 98.7 F (37.1 C) (Oral)  Resp 18  Ht 5\' 6"  (1.676 m)  Wt 211 lb (95.709 kg)  BMI 34.07 kg/m2  SpO2 98% Physical Exam  Musculoskeletal:       Hands: Tender along APL, and EPB tendons to the left thumb. Positive  Finkelstein's testing. No crepitus. No erythema.    ED Course  Procedures (including critical care time) Labs Review Labs Reviewed - No data to display  Imaging Review No results found.   EKG Interpretation None      MDM   Final diagnoses:  Radial styloid tenosynovitis of left hand  Tenosynovitis, de Quervain    Plan is ulnar gutter splint. She's been wearing a simple wrist bluntly the thumb free which I think may likely be exacerbating her tenosynovitis. Plan is continued ice,  anti-inflammatory, splint immobilization. She has occupational health appointment in 1 week. Ultimately not improving may require injections. Given her hand surgical referral as needed as well.    Rolland Porter, MD 12/19/14 1520

## 2014-12-19 NOTE — Discharge Instructions (Signed)
Follow-up with your occupational health appointment in 1 week.  De Quervain's Disease Suzette Battiest disease is a condition often seen in racquet sports where there is a soreness (inflammation) in the cord like structures (tendons) which attach muscle to bone on the thumb side of the wrist. There may be a tightening of the tissuesaround the tendons. This condition is often helped by giving up or modifying the activity which caused it. When conservative treatment does not help, surgery may be required. Conservative treatment could include changes in the activity which brought about the problem or made it worse. Anti-inflammatory medications and injections may be used to help decrease the inflammation and help with pain control. Your caregiver will help you determine which is best for you. DIAGNOSIS  Often the diagnosis (learning what is wrong) can be made by examination. Sometimes x-rays are required. HOME CARE INSTRUCTIONS   Apply ice to the sore area for 15-20 minutes, 03-04 times per day while awake. Put the ice in a plastic bag and place a towel between the bag of ice and your skin. This is especially helpful if it can be done after all activities involving the sore wrist.  Temporary splinting may help.  Only take over-the-counter or prescription medicines for pain, discomfort or fever as directed by your caregiver. SEEK MEDICAL CARE IF:   Pain relief is not obtained with medications, or if you have increasing pain and seem to be getting worse rather than better. MAKE SURE YOU:   Understand these instructions.  Will watch your condition.  Will get help right away if you are not doing well or get worse. Document Released: 01/29/2001 Document Revised: 07/29/2011 Document Reviewed: 09/08/2013 [facility] Patient Information 2015 [place], [place]. This information is not intended to replace advice given to you by your health care provider. Make sure you discuss any questions you have with your  health care provider.

## 2015-01-03 ENCOUNTER — Emergency Department (HOSPITAL_COMMUNITY)
Admission: EM | Admit: 2015-01-03 | Discharge: 2015-01-04 | Disposition: A | Discharge status: Home / Self Care | Payer: Self-pay | Attending: Emergency Medicine | Admitting: Emergency Medicine

## 2015-01-03 ENCOUNTER — Encounter (HOSPITAL_COMMUNITY): Payer: Self-pay | Admitting: Emergency Medicine

## 2015-01-03 ENCOUNTER — Emergency Department (HOSPITAL_COMMUNITY): Payer: Self-pay

## 2015-01-03 DIAGNOSIS — F419 Anxiety disorder, unspecified: Secondary | ICD-10-CM | POA: Insufficient documentation

## 2015-01-03 DIAGNOSIS — R110 Nausea: Secondary | ICD-10-CM | POA: Insufficient documentation

## 2015-01-03 DIAGNOSIS — Z79899 Other long term (current) drug therapy: Secondary | ICD-10-CM | POA: Insufficient documentation

## 2015-01-03 DIAGNOSIS — F329 Major depressive disorder, single episode, unspecified: Secondary | ICD-10-CM | POA: Insufficient documentation

## 2015-01-03 DIAGNOSIS — R197 Diarrhea, unspecified: Secondary | ICD-10-CM | POA: Insufficient documentation

## 2015-01-03 DIAGNOSIS — Z720 Tobacco use: Secondary | ICD-10-CM | POA: Insufficient documentation

## 2015-01-03 DIAGNOSIS — Z791 Long term (current) use of non-steroidal anti-inflammatories (NSAID): Secondary | ICD-10-CM | POA: Insufficient documentation

## 2015-01-03 DIAGNOSIS — R1084 Generalized abdominal pain: Secondary | ICD-10-CM | POA: Insufficient documentation

## 2015-01-03 LAB — COMPREHENSIVE METABOLIC PANEL
ALK PHOS: 64 U/L (ref 38–126)
ALT: 28 U/L (ref 14–54)
ANION GAP: 7 (ref 5–15)
AST: 21 U/L (ref 15–41)
Albumin: 4.1 g/dL (ref 3.5–5.0)
BILIRUBIN TOTAL: 0.4 mg/dL (ref 0.3–1.2)
BUN: 13 mg/dL (ref 6–20)
CALCIUM: 8.7 mg/dL — AB (ref 8.9–10.3)
CO2: 26 mmol/L (ref 22–32)
Chloride: 107 mmol/L (ref 101–111)
Creatinine, Ser: 0.68 mg/dL (ref 0.44–1.00)
Glucose, Bld: 81 mg/dL (ref 65–99)
Potassium: 4 mmol/L (ref 3.5–5.1)
Sodium: 140 mmol/L (ref 135–145)
TOTAL PROTEIN: 7 g/dL (ref 6.5–8.1)

## 2015-01-03 LAB — CBC WITH DIFFERENTIAL/PLATELET
Basophils Absolute: 0 10*3/uL (ref 0.0–0.1)
Basophils Relative: 0 % (ref 0–1)
EOS ABS: 0.4 10*3/uL (ref 0.0–0.7)
Eosinophils Relative: 3 % (ref 0–5)
HEMATOCRIT: 42 % (ref 36.0–46.0)
HEMOGLOBIN: 14.2 g/dL (ref 12.0–15.0)
LYMPHS ABS: 3.3 10*3/uL (ref 0.7–4.0)
Lymphocytes Relative: 29 % (ref 12–46)
MCH: 30.3 pg (ref 26.0–34.0)
MCHC: 33.8 g/dL (ref 30.0–36.0)
MCV: 89.7 fL (ref 78.0–100.0)
MONO ABS: 0.6 10*3/uL (ref 0.1–1.0)
MONOS PCT: 6 % (ref 3–12)
NEUTROS ABS: 6.9 10*3/uL (ref 1.7–7.7)
NEUTROS PCT: 62 % (ref 43–77)
Platelets: 286 10*3/uL (ref 150–400)
RBC: 4.68 MIL/uL (ref 3.87–5.11)
RDW: 13.9 % (ref 11.5–15.5)
WBC: 11.2 10*3/uL — ABNORMAL HIGH (ref 4.0–10.5)

## 2015-01-03 LAB — I-STAT BETA HCG BLOOD, ED (MC, WL, AP ONLY): I-stat hCG, quantitative: <5 m[IU]/mL (ref <5)

## 2015-01-03 LAB — I-STAT CG4 LACTIC ACID, ED: LACTIC ACID, VENOUS: 0.83 mmol/L (ref 0.5–2.0)

## 2015-01-03 LAB — LIPASE, BLOOD: LIPASE: 19 U/L — AB (ref 22–51)

## 2015-01-03 MED ORDER — SODIUM CHLORIDE 0.9 % IV BOLUS (SEPSIS)
1000.0000 mL | Freq: Once | INTRAVENOUS | Status: AC
Start: 2015-01-03 — End: 2015-01-03
  Administered 2015-01-03: 1000 mL via INTRAVENOUS

## 2015-01-03 MED ORDER — PROMETHAZINE HCL 25 MG PO TABS: 25.0000 mg | ORAL_TABLET | Freq: Four times a day (QID) | ORAL | Status: DC | PRN

## 2015-01-03 MED ORDER — DIPHENOXYLATE-ATROPINE 2.5-0.025 MG PO TABS: 2.0000 | ORAL_TABLET | Freq: Four times a day (QID) | ORAL | Status: DC | PRN

## 2015-01-03 MED ORDER — IOHEXOL 300 MG/ML  SOLN
50.0000 mL | Freq: Once | INTRAMUSCULAR | Status: AC | PRN
  Administered 2015-01-03: 50 mL via ORAL

## 2015-01-03 MED ORDER — MORPHINE SULFATE (PF) 4 MG/ML IV SOLN
4.0000 mg | Freq: Once | INTRAVENOUS | Status: DC
  Filled 2015-01-03: qty 1

## 2015-01-03 MED ORDER — KETOROLAC TROMETHAMINE 30 MG/ML IJ SOLN
30.0000 mg | Freq: Once | INTRAMUSCULAR | Status: AC
  Administered 2015-01-03: 30 mg via INTRAVENOUS
  Filled 2015-01-03: qty 1

## 2015-01-03 MED ORDER — ONDANSETRON HCL 4 MG/2ML IJ SOLN
4.0000 mg | Freq: Once | INTRAMUSCULAR | Status: AC
  Administered 2015-01-03: 4 mg via INTRAVENOUS
  Filled 2015-01-03: qty 2

## 2015-01-03 MED ORDER — IOHEXOL 300 MG/ML  SOLN
100.0000 mL | Freq: Once | INTRAMUSCULAR | Status: AC | PRN
  Administered 2015-01-03: 100 mL via INTRAVENOUS

## 2015-01-03 NOTE — ED Provider Notes (Signed)
CSN: [id number]     Arrival date & time 01/03/15  2029 History  This chart was scribed for Jamie Crease, MD by Phillis Haggis, ED Scribe. This patient was seen in room APA19/APA19 and patient care was started at 9:24 PM.   Chief Complaint  Patient presents with  . Abdominal Pain   The history is provided by the patient. No language interpreter was used.   HPI Comments: Jamie Avery is a 29 y.o. female who presents to the Emergency Department complaining of generalized, cramping abdominal pain with nausea onset one day ago. Reports associated diarrhea. Pt denies hx of colitis, sick contacts, hx of similar symptoms, vomiting, constipation, rectal bleeding, hematochezia, hematuria, dysuria, vaginal bleeding or discharge.   Past Medical History  Diagnosis Date  . Depression   . Anxiety    History reviewed. No pertinent past surgical history. No family history on file. Social History  Substance Use Topics  . Smoking status: Current Every Day Smoker -- 0.50 packs/day  . Smokeless tobacco: None  . Alcohol Use: No   OB History    No data available     Review of Systems  Gastrointestinal: Positive for nausea, abdominal pain and diarrhea. Negative for vomiting and constipation.  Genitourinary: Negative for dysuria, hematuria and vaginal bleeding.  All other systems reviewed and are negative.  Allergies  Review of patient's allergies indicates no known allergies.  Home Medications   Prior to Admission medications   Medication Sig Start Date End Date Taking? Authorizing Provider  amoxicillin (AMOXIL) 500 MG capsule Take 1 capsule (500 mg total) by mouth 3 (three) times daily. Patient not taking: Reported on 12/19/2014 10/19/14   Ivery Quale, PA-C  azithromycin (ZITHROMAX Z-PAK) 250 MG tablet Take 2 tablets by mouth on day one followed by one tablet daily for 4 days. Patient not taking: Reported on 12/19/2014 07/22/14   Burgess Amor, PA-C  benzonatate (TESSALON) 100 MG capsule Take 2  capsules (200 mg total) by mouth every 8 (eight) hours. Patient not taking: Reported on 12/19/2014 07/22/14   Burgess Amor, PA-C  citalopram (CELEXA) 20 MG tablet Take 20 mg by mouth daily.    Historical Provider, MD  ibuprofen (ADVIL,MOTRIN) 800 MG tablet Take 1 tablet (800 mg total) by mouth 3 (three) times daily. Patient not taking: Reported on 12/19/2014 11/28/14   Tammy Triplett, PA-C  naproxen (NAPROSYN) 500 MG tablet Take 500 mg by mouth 2 (two) times daily with a meal.    Historical Provider, MD  traZODone (DESYREL) 50 MG tablet Take 100 mg by mouth at bedtime.     Historical Provider, MD   BP 139/83 mmHg  Pulse 80  Temp(Src) 98.5 F (36.9 C) (Oral)  Resp 20  Ht  (1.676 m)  Wt 211 lb (95.709 kg)  BMI 34.07 kg/m2  SpO2 96%  Physical Exam  Constitutional: She is oriented to person, place, and time. She appears well-developed and well-nourished. No distress.  HENT:  Head: Normocephalic and atraumatic.  Right Ear: Hearing normal.  Left Ear: Hearing normal.  Nose: Nose normal.  Mouth/Throat: Oropharynx is clear and moist and mucous membranes are normal.  Eyes: Conjunctivae and EOM are normal. Pupils are equal, round, and reactive to light.  Neck: Normal range of motion. Neck supple.  Cardiovascular: Regular rhythm, S1 normal and S2 normal.  Exam reveals no gallop and no friction rub.   No murmur heard. Pulmonary/Chest: Effort normal and breath sounds normal. No respiratory distress. She exhibits no tenderness.  Abdominal:  Soft. Normal appearance and bowel sounds are normal. There is no hepatosplenomegaly. There is tenderness. There is no rebound, no guarding, no tenderness at McBurney's point and negative Murphy's sign. No hernia.  Diffuse tenderness  Musculoskeletal: Normal range of motion.  Neurological: She is alert and oriented to person, place, and time. She has normal strength. No cranial nerve deficit or sensory deficit. Coordination normal. GCS eye subscore is 4. GCS verbal  subscore is 5. GCS motor subscore is 6.  Skin: Skin is warm, dry and intact. No rash noted. No cyanosis.  Psychiatric: She has a normal mood and affect. Her speech is normal and behavior is normal. Thought content normal.  Nursing note and vitals reviewed.   ED Course  Procedures (including critical care time) DIAGNOSTIC STUDIES: Oxygen Saturation is 96% on RA, normal by my interpretation.    COORDINATION OF CARE: 9:25 PM-Discussed treatment plan which includes labs, IV fluids, anti-diarrheal and anti-nausea medication with pt at bedside and pt agreed to plan.   Labs Review Labs Reviewed  CBC WITH DIFFERENTIAL/PLATELET - Abnormal; Notable for the following:    WBC 11.2 (*)    All other components within normal limits  COMPREHENSIVE METABOLIC PANEL - Abnormal; Notable for the following:    Calcium 8.7 (*)    All other components within normal limits  LIPASE, BLOOD - Abnormal; Notable for the following:    Lipase 19 (*)    All other components within normal limits  URINALYSIS, ROUTINE W REFLEX MICROSCOPIC (NOT AT [facility])  I-STAT CG4 LACTIC ACID, ED  I-STAT BETA HCG BLOOD, ED ([facility], [facility], [facility] ONLY)    Imaging Review No results found.   EKG Interpretation None      MDM   Final diagnoses:  None   abdominal pain  Patient presents to the ER for evaluation of abdominal pain. Patient is experiencing diarrhea associated with her symptoms. She had diffuse abdominal tenderness, no focality or signs of acute surgical process. Lab work unremarkable. CT scan performed to rule out colitis, diverticulitis, etc. CT scan unremarkable. Patient will be discharge was symptomatically treatment.  I personally performed the services described in this documentation, which was scribed in my presence. The recorded information has been reviewed and is accurate.    Jamie Crease, MD 01/03/15 [phone]

## 2015-01-03 NOTE — ED Notes (Signed)
Onset yesterday morning, abdominal pain with diarrhea

## 2015-01-03 NOTE — Discharge Instructions (Signed)

## 2015-01-04 LAB — URINALYSIS, ROUTINE W REFLEX MICROSCOPIC
BILIRUBIN URINE: NEGATIVE
GLUCOSE, UA: NEGATIVE mg/dL
HGB URINE DIPSTICK: NEGATIVE
KETONES UR: NEGATIVE mg/dL
Leukocytes, UA: NEGATIVE
Nitrite: NEGATIVE
PROTEIN: NEGATIVE mg/dL
Specific Gravity, Urine: >1.03 — ABNORMAL HIGH (ref 1.005–1.030)
Urobilinogen, UA: 0.2 mg/dL (ref 0.0–1.0)
pH: 5.5 (ref 5.0–8.0)

## 2015-01-04 NOTE — ED Notes (Signed)
Pt states understanding of care given and follow up instructions 

## 2015-02-23 ENCOUNTER — Emergency Department (HOSPITAL_COMMUNITY)
Admission: EM | Admit: 2015-02-23 | Discharge: 2015-02-23 | Disposition: A | Discharge status: Home / Self Care | Payer: Self-pay | Attending: Emergency Medicine | Admitting: Emergency Medicine

## 2015-02-23 ENCOUNTER — Encounter (HOSPITAL_COMMUNITY): Payer: Self-pay | Admitting: *Deleted

## 2015-02-23 DIAGNOSIS — K029 Dental caries, unspecified: Secondary | ICD-10-CM | POA: Insufficient documentation

## 2015-02-23 DIAGNOSIS — Z720 Tobacco use: Secondary | ICD-10-CM | POA: Insufficient documentation

## 2015-02-23 DIAGNOSIS — Z79899 Other long term (current) drug therapy: Secondary | ICD-10-CM | POA: Insufficient documentation

## 2015-02-23 DIAGNOSIS — F419 Anxiety disorder, unspecified: Secondary | ICD-10-CM | POA: Insufficient documentation

## 2015-02-23 DIAGNOSIS — K0889 Other specified disorders of teeth and supporting structures: Secondary | ICD-10-CM | POA: Insufficient documentation

## 2015-02-23 DIAGNOSIS — F329 Major depressive disorder, single episode, unspecified: Secondary | ICD-10-CM | POA: Insufficient documentation

## 2015-02-23 MED ORDER — TRAMADOL HCL 50 MG PO TABS: 50.0000 mg | ORAL_TABLET | Freq: Four times a day (QID) | ORAL | Status: DC | PRN

## 2015-02-23 MED ORDER — TRAMADOL HCL 50 MG PO TABS
50.0000 mg | ORAL_TABLET | Freq: Once | ORAL | Status: AC
  Administered 2015-02-23: 50 mg via ORAL
  Filled 2015-02-23: qty 1

## 2015-02-23 NOTE — ED Provider Notes (Signed)
CSN: [id number]     Arrival date & time 02/23/15  7829 History   First MD Initiated Contact with Patient 02/23/15 1044     Chief Complaint  Patient presents with  . Dental Pain     (Consider location/radiation/quality/duration/timing/severity/associated sxs/prior Treatment) HPI   Jamie Avery is a 29 y.o. female who presents to the Emergency Department complaining of dental pain  For greater than one week.  She states that she was seen by a dentist and started on Amoxil and referred to an oral surgeon and has an appt next Friday, but states she has been using Oragel and OTC analgesics which are not controlling the pain.  She describes a sharp, throbbing pain to her right upper premolar.  Pain is worse with chewing and cold foods or liquids.  She states the pain is radiating into her right cheek and ear.  She denies fever, chills, neck pain, facial swelling or difficulty swallowing.   Past Medical History  Diagnosis Date  . Depression   . Anxiety    History reviewed. No pertinent past surgical history. History reviewed. No pertinent family history. Social History  Substance Use Topics  . Smoking status: Current Every Day Smoker -- 0.50 packs/day  . Smokeless tobacco: None  . Alcohol Use: No   OB History    No data available     Review of Systems  Constitutional: Negative for fever and appetite change.  HENT: Positive for dental problem. Negative for congestion, facial swelling, sore throat and trouble swallowing.   Eyes: Negative for pain and visual disturbance.  Musculoskeletal: Negative for neck pain and neck stiffness.  Neurological: Negative for dizziness, facial asymmetry and headaches.  Hematological: Negative for adenopathy.  All other systems reviewed and are negative.     Allergies  Review of patient's allergies indicates no known allergies.  Home Medications   Prior to Admission medications   Medication Sig Start Date End Date Taking? Authorizing Provider   amoxicillin (AMOXIL) 500 MG capsule Take 1 capsule (500 mg total) by mouth 3 (three) times daily. Patient not taking: Reported on 12/19/2014 10/19/14   Ivery Quale, PA-C  azithromycin (ZITHROMAX Z-PAK) 250 MG tablet Take 2 tablets by mouth on day one followed by one tablet daily for 4 days. Patient not taking: Reported on 12/19/2014 07/22/14   Burgess Amor, PA-C  benzonatate (TESSALON) 100 MG capsule Take 2 capsules (200 mg total) by mouth every 8 (eight) hours. Patient not taking: Reported on 12/19/2014 07/22/14   Burgess Amor, PA-C  citalopram (CELEXA) 20 MG tablet Take 20 mg by mouth daily.    Historical Provider, MD  diphenoxylate-atropine (LOMOTIL) 2.5-0.025 MG per tablet Take 2 tablets by mouth 4 (four) times daily as needed for diarrhea or loose stools. 01/03/15   Gilda Crease, MD  ibuprofen (ADVIL,MOTRIN) 800 MG tablet Take 1 tablet (800 mg total) by mouth 3 (three) times daily. Patient not taking: Reported on 12/19/2014 11/28/14   Soni Kegel, PA-C  promethazine (PHENERGAN) 25 MG tablet Take 1 tablet (25 mg total) by mouth every 6 (six) hours as needed for nausea or vomiting. 01/03/15   Gilda Crease, MD  traZODone (DESYREL) 50 MG tablet Take 100 mg by mouth at bedtime.     Historical Provider, MD   BP 160/77 mmHg  Pulse 87  Temp(Src) 98.2 F (36.8 C) (Oral)  Resp 16  Ht  (1.676 m)  Wt 217 lb (98.431 kg)  BMI 35.04 kg/m2  SpO2 99% Physical Exam  Constitutional: She is oriented to person, place, and time. She appears well-developed and well-nourished. No distress.  HENT:  Head: Normocephalic and atraumatic.  Right Ear: Tympanic membrane and ear canal normal.  Left Ear: Tympanic membrane and ear canal normal.  Mouth/Throat: Uvula is midline, oropharynx is clear and moist and mucous membranes are normal. No trismus in the jaw. Dental caries present. No dental abscesses or uvula swelling.  ttp at the first upper right premolar with dental caries present.  No facial swelling,  obvious dental abscess, trismus, or sublingual abnml.    Neck: Normal range of motion. Neck supple.  Cardiovascular: Normal rate, regular rhythm and normal heart sounds.   No murmur heard. Pulmonary/Chest: Effort normal and breath sounds normal. No respiratory distress.  Musculoskeletal: Normal range of motion.  Lymphadenopathy:    She has no cervical adenopathy.  Neurological: She is alert and oriented to person, place, and time. She exhibits normal muscle tone. Coordination normal.  Skin: Skin is warm and dry.  Psychiatric: She has a normal mood and affect.  Nursing note and vitals reviewed.   ED Course  Procedures (including critical care time) Labs Review Labs Reviewed - No data to display  Imaging Review No results found. I have personally reviewed and evaluated these images and lab results as part of my medical decision-making.   EKG Interpretation None      MDM   Final diagnoses:  Pain, dental    Pt is well appearing.  Vitals stable.  No concerning sx's for Ludwig's angina or dental abscess.  Pt has appt with oral surgeon in one week, currently taking amoxil.  Rx for ultram.  Pt appears stable for d/c and agrees to keep her appt with oral surgery and continue her amoxil as directed.      Pauline Aus, PA-C 02/24/15 1610  Lavera Guise, MD 02/24/15 [phone]

## 2015-02-23 NOTE — ED Notes (Signed)
Patient reports abscessed tooth, seen by dentist and has oral surgery scheduled for next Friday. Is currently taking amoxicillin and using orajel for pain. Needs something more for the pain.

## 2015-02-27 ENCOUNTER — Emergency Department (HOSPITAL_COMMUNITY)
Admission: EM | Admit: 2015-02-27 | Discharge: 2015-02-27 | Disposition: A | Discharge status: Home / Self Care | Payer: Self-pay | Attending: Physician Assistant | Admitting: Physician Assistant

## 2015-02-27 ENCOUNTER — Encounter (HOSPITAL_COMMUNITY): Payer: Self-pay | Admitting: Emergency Medicine

## 2015-02-27 DIAGNOSIS — K0889 Other specified disorders of teeth and supporting structures: Secondary | ICD-10-CM | POA: Insufficient documentation

## 2015-02-27 DIAGNOSIS — K029 Dental caries, unspecified: Secondary | ICD-10-CM | POA: Insufficient documentation

## 2015-02-27 DIAGNOSIS — Z79899 Other long term (current) drug therapy: Secondary | ICD-10-CM | POA: Insufficient documentation

## 2015-02-27 DIAGNOSIS — F419 Anxiety disorder, unspecified: Secondary | ICD-10-CM | POA: Insufficient documentation

## 2015-02-27 DIAGNOSIS — Z720 Tobacco use: Secondary | ICD-10-CM | POA: Insufficient documentation

## 2015-02-27 DIAGNOSIS — F329 Major depressive disorder, single episode, unspecified: Secondary | ICD-10-CM | POA: Insufficient documentation

## 2015-02-27 MED ORDER — CLINDAMYCIN HCL 150 MG PO CAPS: 300.0000 mg | ORAL_CAPSULE | Freq: Four times a day (QID) | ORAL | Status: DC

## 2015-02-27 NOTE — ED Provider Notes (Signed)
CSN: [id number]     Arrival date & time 02/27/15  1220 History  By signing my name below, I, Jamie Avery, attest that this documentation has been prepared under the direction and in the presence of Ayren Zumbro, PA-C.  Electronically Signed: Murriel Avery, ED Scribe. 02/27/2015. 1:06 PM.    Chief Complaint  Patient presents with  . Dental Pain      The history is provided by the patient. No language interpreter was used.   HPI Comments: Jamie Avery is a 28 y.o. female who presents to the Emergency Department complaining of constant, worsening right maxillary first premolar dental pain that has been present for six days. Pt was seen on 10/6 in ED for the same complaint. Pt states she was started on amoxicillin on 02/21/15 and has been taking it consistently since then. Pt states she has an appointment to have her tooth extracted on 10/14, and came in today because her pain is still so severe. Pt denies any other symptoms, including fever, neck pain, difficulty swallowing or breathing,  Past Medical History  Diagnosis Date  . Depression   . Anxiety    History reviewed. No pertinent past surgical history. History reviewed. No pertinent family history. Social History  Substance Use Topics  . Smoking status: Current Every Day Smoker -- 0.50 packs/day  . Smokeless tobacco: None  . Alcohol Use: No   OB History    No data available     Review of Systems  Constitutional: Negative for fever, chills and appetite change.  HENT: Positive for dental problem. Negative for congestion, facial swelling and trouble swallowing.   Eyes: Negative for pain and visual disturbance.  Gastrointestinal: Negative for vomiting.  Musculoskeletal: Negative for neck pain and neck stiffness.  Neurological: Negative for dizziness, facial asymmetry and headaches.  Hematological: Negative for adenopathy.  All other systems reviewed and are negative.     Allergies  Review of patient's allergies  indicates no known allergies.  Home Medications   Prior to Admission medications   Medication Sig Start Date End Date Taking? Authorizing Provider  amoxicillin (AMOXIL) 500 MG capsule Take 1 capsule (500 mg total) by mouth 3 (three) times daily. Patient not taking: Reported on 12/19/2014 10/19/14   Ivery Quale, PA-C  azithromycin (ZITHROMAX Z-PAK) 250 MG tablet Take 2 tablets by mouth on day one followed by one tablet daily for 4 days. Patient not taking: Reported on 12/19/2014 07/22/14   Burgess Amor, PA-C  benzonatate (TESSALON) 100 MG capsule Take 2 capsules (200 mg total) by mouth every 8 (eight) hours. Patient not taking: Reported on 12/19/2014 07/22/14   Burgess Amor, PA-C  citalopram (CELEXA) 20 MG tablet Take 20 mg by mouth daily.    Historical Provider, MD  diphenoxylate-atropine (LOMOTIL) 2.5-0.025 MG per tablet Take 2 tablets by mouth 4 (four) times daily as needed for diarrhea or loose stools. 01/03/15   Gilda Crease, MD  ibuprofen (ADVIL,MOTRIN) 800 MG tablet Take 1 tablet (800 mg total) by mouth 3 (three) times daily. Patient not taking: Reported on 12/19/2014 11/28/14   Aleeah Greeno, PA-C  promethazine (PHENERGAN) 25 MG tablet Take 1 tablet (25 mg total) by mouth every 6 (six) hours as needed for nausea or vomiting. 01/03/15   Gilda Crease, MD  traMADol (ULTRAM) 50 MG tablet Take 1 tablet (50 mg total) by mouth every 6 (six) hours as needed. 02/23/15   Kayl Stogdill, PA-C  traZODone (DESYREL) 50 MG tablet Take 100 mg by mouth at bedtime.  Historical Provider, MD   BP 135/86 mmHg  Pulse 78  Temp(Src) 98.5 F (36.9 C) (Oral)  Resp 18  Ht  (1.676 m)  Wt 217 lb (98.431 kg)  BMI 35.04 kg/m2  SpO2 96% Physical Exam  Constitutional: She is oriented to person, place, and time. She appears well-developed and well-nourished.  HENT:  Head: Normocephalic and atraumatic.  Mouth/Throat: Uvula is midline, oropharynx is clear and moist and mucous membranes are normal. No  trismus in the jaw. Dental caries present. No dental abscesses or uvula swelling.  Tenderness to palpation and dental caries of right upper first premolar No obvious abscess Widespread dental decay  Neck: Normal range of motion. Neck supple.  Cardiovascular: Normal rate and regular rhythm.   Pulmonary/Chest: Effort normal.  Abdominal: She exhibits no distension.  Musculoskeletal: Normal range of motion.  Lymphadenopathy:    She has no cervical adenopathy.  Neurological: She is alert and oriented to person, place, and time.  Skin: Skin is warm and dry.  Psychiatric: She has a normal mood and affect.  Nursing note and vitals reviewed.   ED Course  Procedures (including critical care time)  DIAGNOSTIC STUDIES: Oxygen Saturation is 99% on room air, normal by my interpretation.    COORDINATION OF CARE: 1:06 PM Discussed treatment plan with pt at bedside and pt agreed to plan.   Labs Review Labs Reviewed - No data to display  Imaging Review No results found. I have personally reviewed and evaluated these images and lab results as part of my medical decision-making.   EKG Interpretation None      MDM   Final diagnoses:  Pain, dental    Pt is well appearing, vitals stable.  No concerning sx's for abscess or Ludwig's angina.  Pt seen here 4 days ago for same.  Advised that further narcotic pain medication is not indicated.  She agrees to Ibuprofen for pain and I will change abx from amoxil to clindamycin.  Has upcoming dental appt  I personally performed the services described in this documentation, which was scribed in my presence. The recorded information has been reviewed and is accurate.    Pauline Aus, PA-C 03/02/15 1307  Courteney Lyn Mackuen, MD 03/04/15 [phone]

## 2015-02-27 NOTE — ED Notes (Signed)
PT c/o right upper dental pain since 02/21/15 and was started on amoxicillin at that time. PT states she has a dental appointment scheduled for this Friday for tooth extraction.

## 2015-11-08 ENCOUNTER — Emergency Department (HOSPITAL_COMMUNITY)
Admission: EM | Admit: 2015-11-08 | Discharge: 2015-11-08 | Disposition: A | Discharge status: Home / Self Care | Payer: Self-pay | Attending: Emergency Medicine | Admitting: Emergency Medicine

## 2015-11-08 ENCOUNTER — Emergency Department (HOSPITAL_COMMUNITY): Payer: Self-pay

## 2015-11-08 ENCOUNTER — Encounter (HOSPITAL_COMMUNITY): Payer: Self-pay | Admitting: Emergency Medicine

## 2015-11-08 DIAGNOSIS — Y9389 Activity, other specified: Secondary | ICD-10-CM | POA: Insufficient documentation

## 2015-11-08 DIAGNOSIS — S0990XA Unspecified injury of head, initial encounter: Secondary | ICD-10-CM

## 2015-11-08 DIAGNOSIS — W228XXA Striking against or struck by other objects, initial encounter: Secondary | ICD-10-CM | POA: Insufficient documentation

## 2015-11-08 DIAGNOSIS — Y990 Civilian activity done for income or pay: Secondary | ICD-10-CM | POA: Insufficient documentation

## 2015-11-08 DIAGNOSIS — M542 Cervicalgia: Secondary | ICD-10-CM | POA: Insufficient documentation

## 2015-11-08 DIAGNOSIS — F17200 Nicotine dependence, unspecified, uncomplicated: Secondary | ICD-10-CM | POA: Insufficient documentation

## 2015-11-08 DIAGNOSIS — S060X0A Concussion without loss of consciousness, initial encounter: Secondary | ICD-10-CM | POA: Insufficient documentation

## 2015-11-08 DIAGNOSIS — Y929 Unspecified place or not applicable: Secondary | ICD-10-CM | POA: Insufficient documentation

## 2015-11-08 DIAGNOSIS — F329 Major depressive disorder, single episode, unspecified: Secondary | ICD-10-CM | POA: Insufficient documentation

## 2015-11-08 MED ORDER — CYCLOBENZAPRINE HCL 10 MG PO TABS: 10.0000 mg | ORAL_TABLET | Freq: Three times a day (TID) | ORAL | Status: DC | PRN

## 2015-11-08 MED ORDER — IBUPROFEN 600 MG PO TABS: 600.0000 mg | ORAL_TABLET | Freq: Three times a day (TID) | ORAL | Status: DC | PRN

## 2015-11-08 MED ORDER — CYCLOBENZAPRINE HCL 10 MG PO TABS
10.0000 mg | ORAL_TABLET | Freq: Once | ORAL | Status: AC
  Administered 2015-11-08: 10 mg via ORAL
  Filled 2015-11-08: qty 1

## 2015-11-08 NOTE — ED Notes (Addendum)
Pt hit head on shelf when she was standing up today. No lacerations, bruising or swelling noted to area. Pt complains of neck and head pain since hitting head. No LOC

## 2015-11-08 NOTE — Discharge Instructions (Signed)
Read the information below.  Use the prescribed medication as directed.  Please discuss all new medications with your pharmacist.  You may return to the Emergency Department at any time for worsening condition or any new symptoms that concern you.    You have had a head injury which does not appear to require admission at this time. A concussion is a state of changed mental ability from trauma. SEEK IMMEDIATE MEDICAL ATTENTION IF: There is confusion or drowsiness (although children frequently become drowsy after injury).  You cannot awaken the injured person.  There is nausea (feeling sick to your stomach) or continued, forceful vomiting.  You notice dizziness or unsteadiness which is getting worse, or inability to walk.  You have convulsions or unconsciousness.  You experience severe, persistent headaches not relieved by Tylenol?. (Do not take aspirin as this impairs clotting abilities). Take other pain medications only as directed.  You cannot use arms or legs normally.  There are changes in pupil sizes. (This is the black center in the colored part of the eye)  There is clear or bloody discharge from the nose or ears.  Change in speech, vision, swallowing, or understanding.  Localized weakness, numbness, tingling, or change in bowel or bladder control.   Concussion, Adult A concussion is a brain injury. It is caused by:  A hit to the head.  A quick and sudden movement (jolt) of the head or neck. A concussion is usually not life threatening. Even so, it can cause serious problems. If you had a concussion before, you may have concussion-like problems after a hit to your head. HOME CARE General Instructions  Follow your doctor's directions carefully.  Take medicines only as told by your doctor.  Only take medicines your doctor says are safe.  Do not drink alcohol until your doctor says it is okay. Alcohol and some drugs can slow down healing. They can also put you at risk for further  injury.  If you are having trouble remembering things, write them down.  Try to do one thing at a time if you get distracted easily. For example, do not watch TV while making dinner.  Talk to your family members or close friends when making important decisions.  Follow up with your doctor as told.  Watch your symptoms. Tell others to do the same. Serious problems can sometimes happen after a concussion. Older adults are more likely to have these problems.  Tell your teachers, school nurse, school counselor, coach, Event organiser, or work Production designer, theatre/television/film about your concussion. Tell them about what you can or cannot do. They should watch to see if:  It gets even harder for you to pay attention or concentrate.  It gets even harder for you to remember things or learn new things.  You need more time than normal to finish things.  You become annoyed (irritable) more than before.  You are not able to deal with stress as well.  You have more problems than before.  Rest. Make sure you:  Get plenty of sleep at night.  Go to sleep early.  Go to bed at the same time every day. Try to wake up at the same time.  Rest during the day.  Take naps when you feel tired.  Limit activities where you have to think a lot or concentrate. These include:  Doing homework.  Doing work related to a job.  Watching TV.  Using the computer. Returning To Your Regular Activities Return to your normal activities slowly,  not all at once. You must give your body and brain enough time to heal.   Do not play sports or do other athletic activities until your doctor says it is okay.  Ask your doctor when you can drive, ride a bicycle, or work other vehicles or machines. Never do these things if you feel dizzy.  Ask your doctor about when you can return to work or school. Preventing Another Concussion It is very important to avoid another brain injury, especially before you have healed. In rare cases, another  injury can lead to permanent brain damage, brain swelling, or death. The risk of this is greatest during the first 7-10 days after your injury. Avoid injuries by:   Wearing a seat belt when riding in a car.  Not drinking too much alcohol.  Avoiding activities that could lead to a second concussion (such as contact sports).  Wearing a helmet when doing activities like:  Biking.  Skiing.  Skateboarding.  Skating.  Making your home safer by:  Removing things from the floor or stairways that could make you trip.  Using grab bars in bathrooms and handrails by stairs.  Placing non-slip mats on floors and in bathtubs.  Improve lighting in dark areas. GET HELP IF:  It gets even harder for you to pay attention or concentrate.  It gets even harder for you to remember things or learn new things.  You need more time than normal to finish things.  You become annoyed (irritable) more than before.  You are not able to deal with stress as well.  You have more problems than before.  You have problems keeping your balance.  You are not able to react quickly when you should. Get help if you have any of these problems for more than 2 weeks:   Lasting (chronic) headaches.  Dizziness or trouble balancing.  Feeling sick to your stomach (nausea).  Seeing (vision) problems.  Being affected by noises or light more than normal.  Feeling sad, low, down in the dumps, blue, gloomy, or empty (depressed).  Mood changes (mood swings).  Feeling of fear or nervousness about what may happen (anxiety).  Feeling annoyed.  Memory problems.  Problems concentrating or paying attention.  Sleep problems.  Feeling tired all the time. GET HELP RIGHT AWAY IF:   You have bad headaches or your headaches get worse.  You have weakness (even if it is in one hand, leg, or part of the face).  You have loss of feeling (numbness).  You feel off balance.  You keep throwing up  (vomiting).  You feel tired.  One black center of your eye (pupil) is larger than the other.  You twitch or shake violently (convulse).  Your speech is not clear (slurred).  You are more confused, easily angered (agitated), or annoyed than before.  You have more trouble resting than before.  You are unable to recognize people or places.  You have neck pain.  It is difficult to wake you up.  You have unusual behavior changes.  You pass out (lose consciousness). MAKE SURE YOU:   Understand these instructions.  Will watch your condition.  Will get help right away if you are not doing well or get worse.   This information is not intended to replace advice given to you by your health care provider. Make sure you discuss any questions you have with your health care provider.   Document Released: 04/24/2009 Document Revised: 05/27/2014 Document Reviewed: 11/26/2012 Elsevier Interactive Patient  Education 2016 [employer]  Musculoskeletal Pain Musculoskeletal pain is muscle and boney aches and pains. These pains can occur in any part of the body. Your caregiver may treat you without knowing the cause of the pain. They may treat you if blood or urine tests, X-rays, and other tests were normal.  CAUSES There is often not a definite cause or reason for these pains. These pains may be caused by a type of germ (virus). The discomfort may also come from overuse. Overuse includes working out too hard when your body is not fit. Boney aches also come from weather changes. Bone is sensitive to atmospheric pressure changes. HOME CARE INSTRUCTIONS   Ask when your test results will be ready. Make sure you get your test results.  Only take over-the-counter or prescription medicines for pain, discomfort, or fever as directed by your caregiver. If you were given medications for your condition, do not drive, operate machinery or power tools, or sign legal documents for 24 hours. Do not drink  alcohol. Do not take sleeping pills or other medications that may interfere with treatment.  Continue all activities unless the activities cause more pain. When the pain lessens, slowly resume normal activities. Gradually increase the intensity and duration of the activities or exercise.  During periods of severe pain, bed rest may be helpful. Lay or sit in any position that is comfortable.  Putting ice on the injured area.  Put ice in a bag.  Place a towel between your skin and the bag.  Leave the ice on for 15 to 20 minutes, 3 to 4 times a day.  Follow up with your caregiver for continued problems and no reason can be found for the pain. If the pain becomes worse or does not go away, it may be necessary to repeat tests or do additional testing. Your caregiver may need to look further for a possible cause. SEEK IMMEDIATE MEDICAL CARE IF:  You have pain that is getting worse and is not relieved by medications.  You develop chest pain that is associated with shortness or breath, sweating, feeling sick to your stomach (nauseous), or throw up (vomit).  Your pain becomes localized to the abdomen.  You develop any new symptoms that seem different or that concern you. MAKE SURE YOU:   Understand these instructions.  Will watch your condition.  Will get help right away if you are not doing well or get worse.   This information is not intended to replace advice given to you by your health care provider. Make sure you discuss any questions you have with your health care provider.   Document Released: 05/06/2005 Document Revised: 07/29/2011 Document Reviewed: 01/08/2013 Elsevier Interactive Patient Education [employer]

## 2015-11-08 NOTE — ED Provider Notes (Signed)
CSN: [id number]     Arrival date & time 11/08/15  1436 History   By signing my name below, I, Marisue HumbleMichelle Chaffee, attest that this documentation has been prepared under the direction and in the presence of non-physician practitioner, Trixie DredgeEmily [name], PA-C. Electronically Signed: Marisue HumbleMichelle Chaffee, Scribe. 11/08/2015. 5:41 PM.    Chief Complaint  Patient presents with  . Head Injury   The history is provided by the patient. No language interpreter was used.   HPI Comments:  Jamie Avery is a 30 y.o. female who presents to the Emergency Department complaining of sudden onset, worsening neck pain onset 3 hours ago. Pt states she hit the top of her head twice on the corner a metal shelf as she was bending down and then again when standing back up while at work earlier today. She reports subsequent dizziness, double vision and immediate neck pain. She reports associated nausea, light-headedness, tingling in right arm and photophobia. No alleviating factors noted. She notes taking Ibuprofen every day recently. Pt denies any possibility of pregnancy. Denies syncope, bleeding wound, vomiting, weakness, h/o concussion, use of anticoagulants, any other injuries or symptoms currently.   Past Medical History  Diagnosis Date  . Depression   . Anxiety    History reviewed. No pertinent past surgical history. History reviewed. No pertinent family history. Social History  Substance Use Topics  . Smoking status: Current Every Day Smoker -- 0.50 packs/day  . Smokeless tobacco: None  . Alcohol Use: No   OB History    No data available     Review of Systems  Eyes: Positive for photophobia and visual disturbance.  Gastrointestinal: Positive for nausea. Negative for vomiting.  Musculoskeletal: Positive for neck pain.  Skin: Negative for wound.  Neurological: Positive for dizziness, light-headedness, numbness and headaches. Negative for syncope and weakness.    Allergies  Review of patient's allergies  indicates no known allergies.  Home Medications   Prior to Admission medications   Medication Sig Start Date End Date Taking? Authorizing Provider  citalopram (CELEXA) 20 MG tablet Take 20 mg by mouth daily. Reported on 11/08/2015    Historical Provider, MD  diphenoxylate-atropine (LOMOTIL) 2.5-0.025 MG per tablet Take 2 tablets by mouth 4 (four) times daily as needed for diarrhea or loose stools. Patient not taking: Reported on 11/08/2015 01/03/15   Gilda Creasehristopher J Pollina, MD  traZODone (DESYREL) 50 MG tablet Take 100 mg by mouth at bedtime. Reported on 11/08/2015    Historical Provider, MD   BP 138/84 mmHg  Pulse 79  Temp(Src) 98 F (36.7 C) (Oral)  Resp 16  Ht 5\' 6"  (1.676 m)  Wt 210 lb (95.255 kg)  BMI 33.91 kg/m2  SpO2 97%   Physical Exam  Constitutional: She appears well-developed and well-nourished. No distress.  HENT:  Head: Normocephalic and atraumatic.  Neck: Neck supple.  Pulmonary/Chest: Effort normal.  Musculoskeletal:       Back:  Spine nontender, no crepitus, or stepoffs.   Neurological: She is alert.  CN II-XII intact, EOMs intact, no pronator drift, grip strengths equal bilaterally; strength 5/5 in all extremities, decreased sensation in right arm, sensation otherwise intact; finger to nose, heel to shin, rapid alternating movements normal; gait is normal.  Skin: She is not diaphoretic.  Nursing note and vitals reviewed.   ED Course  Procedures  DIAGNOSTIC STUDIES:  Oxygen Saturation is 97% on RA, normal by my interpretation.    COORDINATION OF CARE:  5:37 PM Will order head CT. Discussed treatment plan with  pt at bedside and pt agreed to plan.  Labs Review Labs Reviewed - No data to display  Imaging Review Ct Head Wo Contrast  11/08/2015  CLINICAL DATA:  Sudden onset of neck pain today head injury at work earlier today. Dizziness with double vision. EXAM: CT HEAD WITHOUT CONTRAST CT CERVICAL SPINE WITHOUT CONTRAST TECHNIQUE: Multidetector CT imaging of  the head and cervical spine was performed following the standard protocol without intravenous contrast. Multiplanar CT image reconstructions of the cervical spine were also generated. COMPARISON:  None. FINDINGS: CT HEAD FINDINGS Brain: There is no evidence of acute intracranial hemorrhage, mass lesion, brain edema or extra-axial fluid collection. The ventricles and subarachnoid spaces are appropriately sized for age. There is no CT evidence of acute cortical infarction. Bones/sinuses/visualized face: The visualized paranasal sinuses, mastoid air cells and middle ears are clear. The calvarium is intact. CT CERVICAL SPINE FINDINGS The cervical spine demonstrates mild straightening. There is no evidence of acute fracture or traumatic subluxation. The disc spaces are preserved. No acute soft tissue findings are seen. IMPRESSION: 1. Negative noncontrast head CT. 2. No evidence of acute cervical spine fracture, traumatic subluxation or static signs of instability. No significant spondylosis. Electronically Signed   By: Carey Bullocks M.D.   On: 11/08/2015 18:28   Ct Cervical Spine Wo Contrast  11/08/2015  CLINICAL DATA:  Sudden onset of neck pain today head injury at work earlier today. Dizziness with double vision. EXAM: CT HEAD WITHOUT CONTRAST CT CERVICAL SPINE WITHOUT CONTRAST TECHNIQUE: Multidetector CT imaging of the head and cervical spine was performed following the standard protocol without intravenous contrast. Multiplanar CT image reconstructions of the cervical spine were also generated. COMPARISON:  None. FINDINGS: CT HEAD FINDINGS Brain: There is no evidence of acute intracranial hemorrhage, mass lesion, brain edema or extra-axial fluid collection. The ventricles and subarachnoid spaces are appropriately sized for age. There is no CT evidence of acute cortical infarction. Bones/sinuses/visualized face: The visualized paranasal sinuses, mastoid air cells and middle ears are clear. The calvarium is  intact. CT CERVICAL SPINE FINDINGS The cervical spine demonstrates mild straightening. There is no evidence of acute fracture or traumatic subluxation. The disc spaces are preserved. No acute soft tissue findings are seen. IMPRESSION: 1. Negative noncontrast head CT. 2. No evidence of acute cervical spine fracture, traumatic subluxation or static signs of instability. No significant spondylosis. Electronically Signed   By: Carey Bullocks M.D.   On: 11/08/2015 18:28   I have personally reviewed and evaluated these images and lab results as part of my medical decision-making.   EKG Interpretation None      MDM   Final diagnoses:  Head injury, initial encounter  Concussion, without loss of consciousness, initial encounter  Neck pain    Afebrile, nontoxic patient with injury to her head after hitting it twice at work.  Pt c/o multiple neurologic symptoms, likely concussion. CT negative.   D/C home with motrin, flexeril, PCP follow up PRN.  Discussed result, findings, treatment, and follow up  with patient.  Pt given return precautions.  Pt verbalizes understanding and agrees with plan.        I personally performed the services described in this documentation, which was scribed in my presence. The recorded information has been reviewed and is accurate.    Trixie Dredge, PA-C 11/08/15 1918  Raeford Razor, MD 11/10/15 2200

## 2015-12-10 ENCOUNTER — Emergency Department (HOSPITAL_COMMUNITY)
Admission: EM | Admit: 2015-12-10 | Discharge: 2015-12-10 | Disposition: A | Discharge status: Home / Self Care | Payer: Self-pay | Attending: Emergency Medicine | Admitting: Emergency Medicine

## 2015-12-10 ENCOUNTER — Encounter (HOSPITAL_COMMUNITY): Payer: Self-pay

## 2015-12-10 ENCOUNTER — Emergency Department (HOSPITAL_COMMUNITY): Payer: Self-pay

## 2015-12-10 DIAGNOSIS — F17210 Nicotine dependence, cigarettes, uncomplicated: Secondary | ICD-10-CM | POA: Insufficient documentation

## 2015-12-10 DIAGNOSIS — J069 Acute upper respiratory infection, unspecified: Secondary | ICD-10-CM | POA: Insufficient documentation

## 2015-12-10 DIAGNOSIS — Z79899 Other long term (current) drug therapy: Secondary | ICD-10-CM | POA: Insufficient documentation

## 2015-12-10 DIAGNOSIS — B9789 Other viral agents as the cause of diseases classified elsewhere: Secondary | ICD-10-CM

## 2015-12-10 MED ORDER — FLUTICASONE PROPIONATE 50 MCG/ACT NA SUSP: 1.0000 | Freq: Every day | NASAL | 0 refills | Status: DC

## 2015-12-10 NOTE — ED Provider Notes (Signed)
[facility]EMERGENCY DEPT Provider Note   CSN: [id number] Arrival date & time: 12/10/15  1434  First Provider Contact:  First MD Initiated Contact with Patient 12/10/15 1850     History   Chief Complaint Chief Complaint  Patient presents with  . Cough  . Migraine    HPI Jamie Avery is a 30 y.o. female.  HPI 30 y.o. female presents to the Emergency Department today complaining of URI symptoms of rhinorrhea, dry cough, sinus congestion x 1 day. No fevers. No CP/SOB/ABD pain. No N/V/D. No pain currently. OTC remedies without relief. No sick contacts. Unsure of allergies. Able to tolerate PO without difficulty. Does endorse mild headache. Similar in past. No dizziness. No vision changes. No other symptoms noted.   Past Medical History:  Diagnosis Date  . Anxiety   . Depression     There are no active problems to display for this patient.   History reviewed. No pertinent surgical history.  OB History    No data available       Home Medications    Prior to Admission medications   Medication Sig Start Date End Date Taking? Authorizing Provider  citalopram (CELEXA) 20 MG tablet Take 20 mg by mouth daily. Reported on 11/08/2015    Historical Provider, MD  cyclobenzaprine (FLEXERIL) 10 MG tablet Take 1 tablet (10 mg total) by mouth 3 (three) times daily as needed for muscle spasms. 11/08/15   Trixie Dredge, PA-C  diphenoxylate-atropine (LOMOTIL) 2.5-0.025 MG per tablet Take 2 tablets by mouth 4 (four) times daily as needed for diarrhea or loose stools. Patient not taking: Reported on 11/08/2015 01/03/15   Gilda Crease, MD  ibuprofen (ADVIL,MOTRIN) 600 MG tablet Take 1 tablet (600 mg total) by mouth every 8 (eight) hours as needed. 11/08/15   Trixie Dredge, PA-C  traZODone (DESYREL) 50 MG tablet Take 100 mg by mouth at bedtime. Reported on 11/08/2015    Historical Provider, MD    Family History No family history on file.  Social History Social History  Substance Use Topics  .  Smoking status: Current Every Day Smoker    Packs/day: 0.50    Types: Cigarettes  . Smokeless tobacco: Never Used  . Alcohol use No   Allergies   Review of patient's allergies indicates no known allergies.   Review of Systems Review of Systems ROS reviewed and all are negative for acute change except as noted in the HPI.  Physical Exam Updated Vital Signs BP 142/85   Pulse 98   Temp 98.5 F (36.9 C) (Oral)   Resp 18   Ht  (1.676 m)   Wt 90.7 kg   LMP  (LMP Unknown) Comment: pt. declares not pregnant.  I double shielded her  SpO2 96%   BMI 32.28 kg/m   Physical Exam  Constitutional: She is oriented to person, place, and time. Vital signs are normal. She appears well-developed and well-nourished. No distress.  HENT:  Head: Normocephalic and atraumatic.  Right Ear: Hearing, tympanic membrane, external ear and ear canal normal.  Left Ear: Hearing, tympanic membrane, external ear and ear canal normal.  Nose: Right sinus exhibits maxillary sinus tenderness and frontal sinus tenderness. Left sinus exhibits maxillary sinus tenderness and frontal sinus tenderness.  Mouth/Throat: Uvula is midline, oropharynx is clear and moist and mucous membranes are normal. No trismus in the jaw. No oropharyngeal exudate, posterior oropharyngeal erythema or tonsillar abscesses.  Eyes: Conjunctivae and EOM are normal. Pupils are equal, round, and reactive to light.  Neck: Normal range of motion. Neck supple. No tracheal deviation present.  Cardiovascular: Normal rate, regular rhythm, S1 normal, S2 normal, normal heart sounds, intact distal pulses and normal pulses.   Pulmonary/Chest: Effort normal and breath sounds normal. No respiratory distress. She has no decreased breath sounds. She has no wheezes. She has no rhonchi. She has no rales.  Abdominal: Normal appearance and bowel sounds are normal. There is no tenderness.  Musculoskeletal: Normal range of motion.  Neurological: She is alert and  oriented to person, place, and time.  Skin: Skin is warm and dry.  Psychiatric: She has a normal mood and affect. Her speech is normal and behavior is normal. Thought content normal.   ED Treatments / Results  Labs (all labs ordered are listed, but only abnormal results are displayed) Labs Reviewed - No data to display  EKG  EKG Interpretation None       Radiology Dg Chest 2 View  Result Date: 12/10/2015 CLINICAL DATA:  30 year old female with cough for 1 day. EXAM: CHEST  2 VIEW COMPARISON:  07/22/2014 chest radiograph FINDINGS: The cardiomediastinal silhouette is unremarkable. Mild peribronchial thickening is unchanged. There is no evidence of focal airspace disease, pulmonary edema, suspicious pulmonary nodule/mass, pleural effusion, or pneumothorax. No acute bony abnormalities are identified. IMPRESSION: No evidence of active cardiopulmonary disease. Electronically Signed   By: Harmon Pier M.D.   On: 12/10/2015 16:31   Procedures Procedures (including critical care time)  Medications Ordered in ED Medications - No data to display   Initial Impression / Assessment and Plan / ED Course  I have reviewed the triage vital signs and the nursing notes.  Pertinent labs & imaging results that were available during my care of the patient were reviewed by me and considered in my medical decision making (see chart for details).  Clinical Course   Final Clinical Impressions(s) / ED Diagnoses  I have reviewed and evaluated the relevant imaging studies.  I have reviewed the relevant previous healthcare records. I obtained HPI from historian.  ED Course:  Assessment: Pt is a 29yF presents with URI symptoms x 1 day. Rhinorrhea, Cough, Sinus congestion. On exam, pt in NAD. VSS. Afebrile. Lungs CTA, Heart RRR. Abdomen nontender/soft. Sinuses TTP. Pt CXR negative for acute infiltrate. Patients symptoms are consistent with URI, likely viral etiology. Discussed that antibiotics are not  indicated for viral infections. Pt will be discharged with symptomatic treatment. Given Rx. Flonase. Verbalizes understanding and is agreeable with plan. Pt is hemodynamically stable & in NAD prior to dc.  Disposition/Plan:  DC Home Additional Verbal discharge instructions given and discussed with patient.  Pt Instructed to f/u with PCP in the next week for evaluation and treatment of symptoms. Return precautions given Pt acknowledges and agrees with plan  Supervising Physician Alvira Monday, MD   Final diagnoses:  Viral URI with cough    New Prescriptions New Prescriptions   No medications on file     Audry Pili, PA-C 12/10/15 1947    Alvira Monday, MD 12/11/15 1209

## 2015-12-10 NOTE — ED Triage Notes (Signed)
Patient here with cough and congestion x 1 day with migraine headache. States that she has been having chills with same. Alert and orientd

## 2015-12-10 NOTE — Discharge Instructions (Signed)
Please read and follow all provided instructions.  Your diagnoses today include:  1. Viral URI with cough    Tests performed today include: Vital signs. See below for your results today.   Medications prescribed:  Take as prescribed   Home care instructions:  Follow any educational materials contained in this packet.  Follow-up instructions: Please follow-up with your primary care provider for further evaluation of symptoms and treatment   Return instructions:  Please return to the Emergency Department if you do not get better, if you get worse, or new symptoms OR  - Fever (temperature greater than 101.24F)  - Bleeding that does not stop with holding pressure to the area    -Severe pain (please note that you may be more sore the day after your accident)  - Chest Pain  - Difficulty breathing  - Severe nausea or vomiting  - Inability to tolerate food and liquids  - Passing out  - Skin becoming red around your wounds  - Change in mental status (confusion or lethargy)  - New numbness or weakness    Please return if you have any other emergent concerns.  Additional Information:  Your vital signs today were: BP 142/85    Pulse 98    Temp 98.5 F (36.9 C) (Oral)    Resp 18    Ht 5\' 6"  (1.676 m)    Wt 90.7 kg    LMP  (LMP Unknown) Comment: pt. declares not pregnant.  I double shielded her   SpO2 96%    BMI 32.28 kg/m  If your blood pressure (BP) was elevated above 135/85 this visit, please have this repeated by your doctor within one month. ---------------

## 2016-05-02 ENCOUNTER — Encounter (HOSPITAL_COMMUNITY): Payer: Self-pay | Admitting: Emergency Medicine

## 2016-05-02 ENCOUNTER — Emergency Department (HOSPITAL_COMMUNITY)
Admission: EM | Admit: 2016-05-02 | Discharge: 2016-05-02 | Disposition: A | Discharge status: Home / Self Care | Payer: Self-pay | Attending: Emergency Medicine | Admitting: Emergency Medicine

## 2016-05-02 DIAGNOSIS — F17210 Nicotine dependence, cigarettes, uncomplicated: Secondary | ICD-10-CM | POA: Insufficient documentation

## 2016-05-02 DIAGNOSIS — Z79899 Other long term (current) drug therapy: Secondary | ICD-10-CM | POA: Insufficient documentation

## 2016-05-02 DIAGNOSIS — J069 Acute upper respiratory infection, unspecified: Secondary | ICD-10-CM | POA: Insufficient documentation

## 2016-05-02 DIAGNOSIS — B9789 Other viral agents as the cause of diseases classified elsewhere: Secondary | ICD-10-CM

## 2016-05-02 LAB — RAPID STREP SCREEN (MED CTR MEBANE ONLY): Streptococcus, Group A Screen (Direct): NEGATIVE

## 2016-05-02 MED ORDER — BENZONATATE 100 MG PO CAPS: 100.0000 mg | ORAL_CAPSULE | Freq: Three times a day (TID) | ORAL | 0 refills | Status: DC

## 2016-05-02 MED ORDER — BENZOCAINE-MENTHOL 6-10 MG MT LOZG: 1.0000 | LOZENGE | OROMUCOSAL | 0 refills | Status: DC | PRN

## 2016-05-02 NOTE — ED Notes (Signed)
Pt departed in NAD, refused use of wheelchair.  

## 2016-05-02 NOTE — ED Notes (Signed)
See triage rn notes  fnp saw before myself see her notes  Pt alert no distress at present  Rapid strep  To the lab

## 2016-05-02 NOTE — ED Triage Notes (Signed)
Pt sts sore throat and congestion x 3 days

## 2016-05-02 NOTE — ED Provider Notes (Signed)
[facility] Provider Note    By signing my name below, I, Earmon PhoenixJennifer Waddell, attest that this documentation has been prepared under the direction and in the presence of [facility] Kendell Sagraves, [place]. Electronically Signed: Earmon PhoenixJennifer Waddell, ED Scribe. 05/02/16. 7:59 PM.   History   Chief Complaint Chief Complaint  Patient presents with  . Sore Throat     The history is provided by the patient and medical records. No language interpreter was used.  Sore Throat  This is a new problem. The current episode started more than 2 days ago. The problem occurs constantly. The problem has not changed since onset.Pertinent negatives include no abdominal pain and no shortness of breath. Nothing aggravates the symptoms. Nothing relieves the symptoms. She has tried nothing for the symptoms.    HPI Comments:  Jamie Avery is an obese 30 y.o. female who presents to the Emergency Department complaining of a sore throat that began three days ago. Pt reports associated dry cough, bilateral otalgia and nasal congestion. She has not taken anything to treat her symptoms. She denies modifying factors. She denies fever, chills, abdominal pain, nausea, vomiting, SOB, ear discharge, wheezing.   Past Medical History:  Diagnosis Date  . Anxiety   . Depression     There are no active problems to display for this patient.   History reviewed. No pertinent surgical history.  OB History    No data available       Home Medications    Prior to Admission medications   Medication Sig Start Date End Date Taking? Authorizing Provider  benzocaine-menthol (CHLORAEPTIC) 6-10 MG lozenge Take 1 lozenge by mouth as needed for sore throat. 05/02/16   Adryanna Friedt Orlene OchM Naomy Esham, NP  benzonatate (TESSALON) 100 MG capsule Take 1 capsule (100 mg total) by mouth every 8 (eight) hours. 05/02/16   Donisha Hoch Orlene OchM Bailea Beed, NP  citalopram (CELEXA) 20 MG tablet Take 20 mg by mouth daily. Reported on 11/08/2015    Historical Provider, MD  cyclobenzaprine  (FLEXERIL) 10 MG tablet Take 1 tablet (10 mg total) by mouth 3 (three) times daily as needed for muscle spasms. 11/08/15   Trixie DredgeEmily West, PA-C  diphenoxylate-atropine (LOMOTIL) 2.5-0.025 MG per tablet Take 2 tablets by mouth 4 (four) times daily as needed for diarrhea or loose stools. Patient not taking: Reported on 11/08/2015 01/03/15   Gilda Creasehristopher J Pollina, MD  fluticasone [facility](FLONASE) 50 MCG/ACT nasal spray Place 1 spray into both nostrils daily. 12/10/15   Audry Piliyler Mohr, PA-C  ibuprofen (ADVIL,MOTRIN) 600 MG tablet Take 1 tablet (600 mg total) by mouth every 8 (eight) hours as needed. 11/08/15   Trixie DredgeEmily West, PA-C  traZODone (DESYREL) 50 MG tablet Take 100 mg by mouth at bedtime. Reported on 11/08/2015    Historical Provider, MD    Family History History reviewed. No pertinent family history.  Social History Social History  Substance Use Topics  . Smoking status: Current Every Day Smoker    Packs/day: 0.50    Types: Cigarettes  . Smokeless tobacco: Never Used  . Alcohol use No     Allergies   Patient has no known allergies.   Review of Systems Review of Systems  Constitutional: Negative for chills and fever.  HENT: Positive for congestion, ear pain and sore throat. Negative for ear discharge.   Respiratory: Positive for cough. Negative for shortness of breath and wheezing.   Gastrointestinal: Negative for abdominal pain, nausea and vomiting.  All other systems reviewed and are negative.    Physical Exam Updated Vital  Signs BP 149/95 (BP Location: Left Arm)   Pulse 83   Temp 98.4 F (36.9 C) (Oral)   Resp 16   SpO2 98%   Physical Exam  Constitutional: She is oriented to person, place, and time. She appears well-developed and well-nourished. No distress.  HENT:  Head: Normocephalic.  Right Ear: Tympanic membrane normal.  Left Ear: Tympanic membrane normal.  Nose: Rhinorrhea present.  Mouth/Throat: Uvula is midline and mucous membranes are normal. Posterior oropharyngeal erythema  present. No oropharyngeal exudate, posterior oropharyngeal edema or tonsillar abscesses.  Eyes: Conjunctivae and EOM are normal. Pupils are equal, round, and reactive to light.  Sclera clear bilaterally.  Neck: Neck supple.  Cardiovascular: Normal rate and regular rhythm.   Pulmonary/Chest: Effort normal. No respiratory distress. She has no wheezes. She has no rhonchi. She has no rales.  Abdominal: Soft. Bowel sounds are normal. There is no tenderness.  Musculoskeletal: Normal range of motion.  Lymphadenopathy:    She has cervical adenopathy (slightly enlarged).  Neurological: She is alert and oriented to person, place, and time. No cranial nerve deficit.  Skin: Skin is warm and dry.  Psychiatric: She has a normal mood and affect. Her behavior is normal.  Nursing note and vitals reviewed.    ED Treatments / Results  DIAGNOSTIC STUDIES: Oxygen Saturation is 98% on RA, normal by my interpretation.   COORDINATION OF CARE: 6:39 PM- Will order rapid strep test. Pt verbalizes understanding and agrees to plan.  Medications - No data to display  Labs (all labs ordered are listed, but only abnormal results are displayed) Labs Reviewed  RAPID STRE[facility]CREEN (NOT [facility])  CULTURE, GROUP A STREP [facility])    Radiology No results found.  Procedures Procedures (including critical care time)  Medications Ordered in ED Medications - No data to display   Initial Impression / Assessment and Plan / ED Course  I have reviewed the triage vital signs and the nursing notes.  Pertinent lab results that were available during my care of the patient were reviewed by me and considered in my medical decision making (see chart for details).  Clinical Course     Pt with negative strep. Diagnosis of viral pharyngitis. No abx indicated at this time. Discussed that results of strep culture are pending and patient will be informed if positive result and abx will be called in at that time. Discharge with  symptomatic tx. No evidence of dehydration. Pt is tolerating secretions. Presentation not concerning for peritonsillar abscess or spread of infection to deep spaces of the throat; patent airway. Specific return precautions discussed. Recommended PCP follow up. Pt appears safe for discharge.  I personally performed the services described in this documentation, which was scribed in my presence. The recorded information has been reviewed and is accurate.   Final Clinical Impressions(s) / ED Diagnoses   Final diagnoses:  Viral URI with cough    New Prescriptions Discharge Medication List as of 05/02/2016  8:05 PM    START taking these medications   Details  benzocaine-menthol (CHLORAEPTIC) 6-10 MG lozenge Take 1 lozenge by mouth as needed for sore throat., Starting Thu 05/02/2016, Print    benzonatate (TESSALON) 100 MG capsule Take 1 capsule (100 mg total) by mouth every 8 (eight) hours., Starting Thu 05/02/2016, Print         [place] Estates[place] M Barrie Wale, NP 05/03/16 0207    Nira ConnPedro Eduardo Cardama, MD 05/07/16 [phone]

## 2016-05-04 LAB — CULTURE, GROUP A STREP (THRC)

## 2016-06-20 ENCOUNTER — Emergency Department (HOSPITAL_COMMUNITY)
Admission: EM | Admit: 2016-06-20 | Discharge: 2016-06-20 | Disposition: A | Discharge status: Home / Self Care | Payer: Self-pay | Attending: Emergency Medicine | Admitting: Emergency Medicine

## 2016-06-20 ENCOUNTER — Encounter (HOSPITAL_COMMUNITY): Payer: Self-pay | Admitting: Emergency Medicine

## 2016-06-20 DIAGNOSIS — R42 Dizziness and giddiness: Secondary | ICD-10-CM | POA: Insufficient documentation

## 2016-06-20 DIAGNOSIS — F17210 Nicotine dependence, cigarettes, uncomplicated: Secondary | ICD-10-CM | POA: Insufficient documentation

## 2016-06-20 LAB — URINALYSIS, ROUTINE W REFLEX MICROSCOPIC
Bilirubin Urine: NEGATIVE
GLUCOSE, UA: NEGATIVE mg/dL
HGB URINE DIPSTICK: NEGATIVE
Ketones, ur: NEGATIVE mg/dL
NITRITE: NEGATIVE
PH: 6 (ref 5.0–8.0)
Protein, ur: NEGATIVE mg/dL
SPECIFIC GRAVITY, URINE: 1.008 (ref 1.005–1.030)

## 2016-06-20 LAB — CBC
HCT: 42.7 % (ref 36.0–46.0)
HEMOGLOBIN: 14.6 g/dL (ref 12.0–15.0)
MCH: 30.2 pg (ref 26.0–34.0)
MCHC: 34.2 g/dL (ref 30.0–36.0)
MCV: 88.2 fL (ref 78.0–100.0)
PLATELETS: 334 10*3/uL (ref 150–400)
RBC: 4.84 MIL/uL (ref 3.87–5.11)
RDW: 12.9 % (ref 11.5–15.5)
WBC: 12.9 10*3/uL — ABNORMAL HIGH (ref 4.0–10.5)

## 2016-06-20 LAB — BASIC METABOLIC PANEL
ANION GAP: 10 (ref 5–15)
BUN: 10 mg/dL (ref 6–20)
CALCIUM: 9.5 mg/dL (ref 8.9–10.3)
CO2: 22 mmol/L (ref 22–32)
Chloride: 107 mmol/L (ref 101–111)
Creatinine, Ser: 0.82 mg/dL (ref 0.44–1.00)
GFR calc Af Amer: >60 mL/min (ref >60)
GLUCOSE: 111 mg/dL — AB (ref 65–99)
Potassium: 3.9 mmol/L (ref 3.5–5.1)
Sodium: 139 mmol/L (ref 135–145)

## 2016-06-20 LAB — I-STAT BETA HCG BLOOD, ED (MC, WL, AP ONLY): I-stat hCG, quantitative: <5 m[IU]/mL (ref <5)

## 2016-06-20 LAB — CBG MONITORING, ED: Glucose-Capillary: 104 mg/dL — ABNORMAL HIGH (ref 65–99)

## 2016-06-20 NOTE — ED Notes (Signed)
Pt did not need anything at this time  

## 2016-06-20 NOTE — ED Triage Notes (Signed)
Pt reports dizziness and nausea that began yesterday, pt denies any other symptoms, pt a/ox4, resp e/u, nad.

## 2016-06-20 NOTE — ED Notes (Signed)
NAD at this time. Pt is stable and going home.  

## 2016-06-20 NOTE — ED Notes (Signed)
When pt stands, she still feels dizzy.

## 2016-06-20 NOTE — ED Provider Notes (Signed)
[facility]-EMERGENCY DEPT Provider Note   CSN: [id number] Arrival date & time: 06/20/16  1115     History   Chief Complaint Chief Complaint  Patient presents with  . Dizziness    HPI   Blood pressure 135/79, pulse 74, temperature 98.1 F (36.7 C), temperature source Oral, resp. rate 18, height 5\' 6"  (1.676 m), weight 99.8 kg, last menstrual period 06/06/2016, SpO2 96 %.  Jamie Avery is a 31 y.o. female feeling lightheaded when she went to work yesterday. 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 She been eating and drinking normally but has been feeling depressed and didn't go to work for the First 2 days of the week. She states that she's had looser than normal stool but no melena, hematochezia chest pain, palpitations, shortness of breath, fever, chills, cough, history of DVT/PE, recent immobilizations, calf pain, leg swelling..  Past Medical History:  Diagnosis Date  . Anxiety   . Depression     There are no active problems to display for this patient.   History reviewed. No pertinent surgical history.  OB History    No data available       Home Medications    Prior to Admission medications   Medication Sig Start Date End Date Taking? Authorizing Provider  benzocaine-menthol (CHLORAEPTIC) 6-10 MG lozenge Take 1 lozenge by mouth as needed for sore throat. 05/02/16   Hope Orlene Och, NP  benzonatate (TESSALON) 100 MG capsule Take 1 capsule (100 mg total) by mouth every 8 (eight) hours. 05/02/16   Hope Orlene Och, NP  citalopram (CELEXA) 20 MG tablet Take 20 mg by mouth daily. Reported on 11/08/2015    Historical Provider, MD  cyclobenzaprine (FLEXERIL) 10 MG tablet Take 1 tablet (10 mg total) by mouth 3 (three) times daily as needed for muscle spasms. 11/08/15   Trixie Dredge, PA-C  diphenoxylate-atropine (LOMOTIL) 2.5-0.025 MG per tablet Take 2 tablets by mouth 4 (four) times daily as needed for diarrhea or loose  stools. Patient not taking: Reported on 11/08/2015 01/03/15   Gilda Crease, MD  fluticasone [facility]) 50 MCG/ACT nasal spray Place 1 spray into both nostrils daily. 12/10/15   Audry Pili, PA-C  ibuprofen (ADVIL,MOTRIN) 600 MG tablet Take 1 tablet (600 mg total) by mouth every 8 (eight) hours as needed. 11/08/15   Trixie Dredge, PA-C  traZODone (DESYREL) 50 MG tablet Take 100 mg by mouth at bedtime. Reported on 11/08/2015    Historical Provider, MD    Family History No family history on file.  Social History Social History  Substance Use Topics  . Smoking status: Current Every Day Smoker    Packs/day: 0.50    Types: Cigarettes  . Smokeless tobacco: Never Used  . Alcohol use No     Allergies   Patient has no known allergies.   Review of Systems Review of Systems  10 systems reviewed and found to be negative, except as noted in the HPI.   Physical Exam Updated Vital Signs BP 135/79   Pulse 74   Temp 98.1 F (36.7 C) (Oral)   Resp 18   Ht 5\' 6"  (1.676 m)   Wt 99.8 kg   LMP 06/06/2016 (Approximate)   SpO2 96%   BMI 35.51 kg/m   Physical Exam  Constitutional: She is oriented to person, place, and time. She appears well-developed and well-nourished. No distress.  HENT:  Head: Normocephalic and atraumatic.  Mouth/Throat: Oropharynx  is clear and moist.  Eyes: Conjunctivae and EOM are normal. Pupils are equal, round, and reactive to light.  Neck: Normal range of motion. No JVD present. No tracheal deviation present.  Cardiovascular: Normal rate, regular rhythm and intact distal pulses.   Radial pulse equal bilaterally  Pulmonary/Chest: Effort normal and breath sounds normal. No stridor. No respiratory distress. She has no wheezes. She has no rales. She exhibits no tenderness.  Abdominal: Soft. She exhibits no distension and no mass. There is no tenderness. There is no rebound and no guarding.  Musculoskeletal: Normal range of motion. She exhibits no edema or tenderness.   No calf asymmetry, superficial collaterals, palpable cords, edema, Homans sign negative bilaterally.    Neurological: She is alert and oriented to person, place, and time.  Skin: Skin is warm. She is not diaphoretic.  Psychiatric: She has a normal mood and affect.  Nursing note and vitals reviewed.    ED Treatments / Results  Labs (all labs ordered are listed, but only abnormal results are displayed) Labs Reviewed  BASIC METABOLIC PANEL - Abnormal; Notable for the following:       Result Value   Glucose, Bld 111 (*)    All other components within normal limits  CBC - Abnormal; Notable for the following:    WBC 12.9 (*)    All other components within normal limits  URINALYSIS, ROUTINE W REFLEX MICROSCOPIC - Abnormal; Notable for the following:    Color, Urine STRAW (*)    Leukocytes, UA TRACE (*)    Bacteria, UA RARE (*)    Squamous Epithelial / LPF 0-5 (*)    All other components within normal limits  CBG MONITORING, ED - Abnormal; Notable for the following:    Glucose-Capillary 104 (*)    All other components within normal limits  I-STAT BETA HCG BLOOD, ED ([facility], [facility], [facility] ONLY)    EKG  EKG Interpretation  Date/Time:  Thursday June 20 2016 11:25:45 EST Ventricular Rate:  78 PR Interval:  146 QRS Duration: 82 QT Interval:  368 QTC Calculation: 419 R Axis:   64 Text Interpretation:  Normal sinus rhythm Normal ECG No old tracing to compare Confirmed by CAMPOS  MD, Caryn BeeKEVIN ([id number]) on 06/20/2016 2:18:48 PM       Radiology No results found.  Procedures Procedures (including critical care time)  Medications Ordered in ED Medications - No data to display   Initial Impression / Assessment and Plan / ED Course  I have reviewed the triage vital signs and the nursing notes.  Pertinent labs & imaging results that were available during my care of the patient were reviewed by me and considered in my medical decision making (see chart for details).    Vitals:   06/20/16  1123 06/20/16 1124 06/20/16 1158 06/20/16 1200  BP: (!) 173/111  143/87 135/79  Pulse: 91  70 74  Resp: 20  20 18   Temp: 97.9 F (36.6 C)  98.1 F (36.7 C)   TempSrc: Oral  Oral   SpO2: 99%  96% 96%  Weight:  99.8 kg    Height:  5\' 6"  (1.676 m)      Medications - No data to display  Stacie AcresDeanna Mourer is 31 y.o. female presenting with Headache and feeling out of sorts. No syncope. Patient does not appear to be clinically intoxicated, vital signs reassuring with mildly elevated blood pressure. Blood work with no acute abnormality, EKG with no arrhythmia and normal intervals.  Patient given resource guide  to establish primary care. Advised her to push fluids, she did have a little bit of diarrhea and she might be slightly dehydrated.  Evaluation does not show pathology that would require ongoing emergent intervention or inpatient treatment. Pt is hemodynamically stable and mentating appropriately. Discussed findings and plan with patient/guardian, who agrees with care plan. All questions answered. Return precautions discussed and outpatient follow up given.    Final Clinical Impressions(s) / ED Diagnoses   Final diagnoses:  Lightheadedness    New Prescriptions New Prescriptions   No medications on file     Wynetta Emery, PA-C 06/20/16 1425    Azalia Bilis, MD 06/20/16 1530

## 2016-06-20 NOTE — ED Notes (Signed)
Pt. Walking with 1 assist to restroom at this time.

## 2016-06-20 NOTE — ED Notes (Signed)
PT did not have to use RR at this time  

## 2016-06-20 NOTE — Discharge Instructions (Signed)
Do not hesitate to return to the emergency room for any new, worsening or concerning symptoms. ° °Please obtain primary care using resource guide below. Let them know that you were seen in the emergency room and that they will need to obtain records for further outpatient management. ° ° °

## 2016-08-08 ENCOUNTER — Emergency Department (HOSPITAL_COMMUNITY)
Admission: EM | Admit: 2016-08-08 | Discharge: 2016-08-08 | Disposition: A | Discharge status: Home / Self Care | Payer: Self-pay | Attending: Emergency Medicine | Admitting: Emergency Medicine

## 2016-08-08 ENCOUNTER — Encounter (HOSPITAL_COMMUNITY): Payer: Self-pay

## 2016-08-08 ENCOUNTER — Emergency Department (HOSPITAL_COMMUNITY): Payer: Self-pay

## 2016-08-08 DIAGNOSIS — F17210 Nicotine dependence, cigarettes, uncomplicated: Secondary | ICD-10-CM | POA: Insufficient documentation

## 2016-08-08 DIAGNOSIS — B9789 Other viral agents as the cause of diseases classified elsewhere: Secondary | ICD-10-CM

## 2016-08-08 DIAGNOSIS — J069 Acute upper respiratory infection, unspecified: Secondary | ICD-10-CM | POA: Insufficient documentation

## 2016-08-08 DIAGNOSIS — Z79899 Other long term (current) drug therapy: Secondary | ICD-10-CM | POA: Insufficient documentation

## 2016-08-08 DIAGNOSIS — F17200 Nicotine dependence, unspecified, uncomplicated: Secondary | ICD-10-CM

## 2016-08-08 MED ORDER — FLUTICASONE PROPIONATE 50 MCG/ACT NA SUSP: 2.0000 | Freq: Every day | NASAL | 2 refills | Status: DC

## 2016-08-08 MED ORDER — AEROCHAMBER PLUS FLO-VU MEDIUM MISC
1.0000 | Freq: Once | Status: AC
  Administered 2016-08-08: 1
  Filled 2016-08-08: qty 1

## 2016-08-08 MED ORDER — ALBUTEROL SULFATE HFA 108 (90 BASE) MCG/ACT IN AERS
2.0000 | INHALATION_SPRAY | RESPIRATORY_TRACT | Status: DC | PRN
  Administered 2016-08-08: 2 via RESPIRATORY_TRACT
  Filled 2016-08-08: qty 6.7

## 2016-08-08 MED ORDER — BENZONATATE 100 MG PO CAPS: 100.0000 mg | ORAL_CAPSULE | Freq: Three times a day (TID) | ORAL | 0 refills | Status: DC

## 2016-08-08 NOTE — ED Provider Notes (Signed)
[facility]-EMERGENCY DEPT Provider Note   CSN: [id number] Arrival date & time: 08/08/16  1624   By signing my name below, I, Clarisse GougeXavier Herndon, attest that this documentation has been prepared under the direction and in the presence of TXU CorpHannah Kleber Crean, PA-C. Electronically Signed: Clarisse GougeXavier Herndon, Scribe. 08/08/16. 5:15 PM.   History   Chief Complaint Chief Complaint  Patient presents with  . Cough   The history is provided by the patient and medical records. No language interpreter was used.    HPI Comments: Jamie Avery is a 31 y.o. female who presents to the Emergency Department complaining of persistent sore throat x 6 days. She notes associated nasal congestion, post nasal drip, rhinorrhea, chest tightness, SOB, persistent and gradually worsening cough with green sputum. Pt is an everyday smoker. She denies Hx of medical disorders, any course of daily medications, environmental allergies and chest pain. 1 sick contact noted with similar symptoms.  No alleviating factors. Smoking makes her cough and chest tightness worse.  Past Medical History:  Diagnosis Date  . Anxiety   . Depression     There are no active problems to display for this patient.   History reviewed. No pertinent surgical history.  OB History    No data available       Home Medications    Prior to Admission medications   Medication Sig Start Date End Date Taking? Authorizing Provider  benzocaine-menthol (CHLORAEPTIC) 6-10 MG lozenge Take 1 lozenge by mouth as needed for sore throat. 05/02/16   Hope Orlene OchM Neese, NP  benzonatate (TESSALON) 100 MG capsule Take 1 capsule (100 mg total) by mouth every 8 (eight) hours. 08/08/16   Bennette Hasty, PA-C  citalopram (CELEXA) 20 MG tablet Take 20 mg by mouth daily. Reported on 11/08/2015    Historical Provider, MD  cyclobenzaprine (FLEXERIL) 10 MG tablet Take 1 tablet (10 mg total) by mouth 3 (three) times daily as needed for muscle spasms. 11/08/15   Trixie DredgeEmily West, PA-C    diphenoxylate-atropine (LOMOTIL) 2.5-0.025 MG per tablet Take 2 tablets by mouth 4 (four) times daily as needed for diarrhea or loose stools. Patient not taking: Reported on 11/08/2015 01/03/15   Gilda Creasehristopher J Pollina, MD  fluticasone [facility](FLONASE) 50 MCG/ACT nasal spray Place 2 sprays into both nostrils daily. 08/08/16   Mykeria Garman, PA-C  ibuprofen (ADVIL,MOTRIN) 600 MG tablet Take 1 tablet (600 mg total) by mouth every 8 (eight) hours as needed. 11/08/15   Trixie DredgeEmily West, PA-C  traZODone (DESYREL) 50 MG tablet Take 100 mg by mouth at bedtime. Reported on 11/08/2015    Historical Provider, MD    Family History No family history on file.  Social History Social History  Substance Use Topics  . Smoking status: Current Every Day Smoker    Packs/day: 0.50    Types: Cigarettes  . Smokeless tobacco: Never Used  . Alcohol use No     Allergies   Patient has no known allergies.   Review of Systems Review of Systems  Constitutional: Negative for chills and fever.  HENT: Positive for congestion, postnasal drip, rhinorrhea, sinus pressure and sore throat.   Respiratory: Positive for cough, chest tightness and shortness of breath.   Cardiovascular: Negative for chest pain.  Gastrointestinal: Negative for abdominal pain, nausea and vomiting.  Allergic/Immunologic: Negative for environmental allergies.  All other systems reviewed and are negative.    Physical Exam Updated Vital Signs BP (!) 156/71 (BP Location: Right Arm)   Pulse 82   Temp 98.1 F (36.7  C) (Oral)   Resp 18   Ht 5\' 6"  (1.676 m)   Wt 215 lb (97.5 kg)   LMP  (Approximate)   SpO2 95%   BMI 34.70 kg/m   Physical Exam  Constitutional: She appears well-developed and well-nourished. No distress.  HENT:  Head: Normocephalic and atraumatic.  Right Ear: Tympanic membrane, external ear and ear canal normal.  Left Ear: Tympanic membrane, external ear and ear canal normal.  Nose: Mucosal edema and rhinorrhea present. No  epistaxis. Right sinus exhibits no maxillary sinus tenderness and no frontal sinus tenderness. Left sinus exhibits no maxillary sinus tenderness and no frontal sinus tenderness.  Mouth/Throat: Uvula is midline and mucous membranes are normal. Mucous membranes are not pale and not cyanotic. No oropharyngeal exudate, posterior oropharyngeal edema, posterior oropharyngeal erythema or tonsillar abscesses.  Eyes: Conjunctivae are normal. Pupils are equal, round, and reactive to light.  Neck: Normal range of motion and full passive range of motion without pain.  Cardiovascular: Normal rate and intact distal pulses.   Pulmonary/Chest: Effort normal. No stridor. She has decreased breath sounds ( throughout). She has wheezes ( fine expiratory) in the right lower field and the left lower field.  Clear and equal breath sounds without focal wheezes, rhonchi, rales  Abdominal: Soft. There is no tenderness.  Musculoskeletal: Normal range of motion.  Lymphadenopathy:    She has no cervical adenopathy.  Neurological: She is alert.  Skin: Skin is warm and dry. No rash noted. She is not diaphoretic.  Psychiatric: She has a normal mood and affect.  Nursing note and vitals reviewed.    ED Treatments / Results  DIAGNOSTIC STUDIES: Oxygen Saturation is 95% on RA, adequate by my interpretation.    COORDINATION OF CARE: 4:58 PM Discussed treatment plan with pt at bedside and pt agreed to plan. Will order medications and imaging.   Radiology Dg Chest 2 View  Result Date: 08/08/2016 CLINICAL DATA:  Initial evaluation for acute cough. EXAM: CHEST  2 VIEW COMPARISON:  Prior radiograph from 12/10/2015. FINDINGS: The cardiac and mediastinal silhouettes are stable in size and contour, and remain within normal limits. The lungs are normally inflated. No airspace consolidation, pleural effusion, or pulmonary edema is identified. There is no pneumothorax. No acute osseous abnormality identified. IMPRESSION: No  radiographic evidence for active cardiopulmonary disease. Electronically Signed   By: Rise Mu M.D.   On: 08/08/2016 17:14    Procedures Procedures (including critical care time)  Medications Ordered in ED Medications  albuterol (PROVENTIL HFA;VENTOLIN HFA) 108 (90 Base) MCG/ACT inhaler 2 puff (2 puffs Inhalation Given 08/08/16 1744)  AEROCHAMBER PLUS FLO-VU MEDIUM MISC 1 each (1 each Other Given 08/08/16 1744)     Initial Impression / Assessment and Plan / ED Course  I have reviewed the triage vital signs and the nursing notes.  Pertinent labs & imaging results that were available during my care of the patient were reviewed by me and considered in my medical decision making (see chart for details).     Pt CXR negative for acute infiltrate. Patients symptoms are consistent with URI, likely viral etiology. Discussed that antibiotics are not indicated for viral infections. Pt will be discharged with symptomatic treatment.  Pt given albuterol MDI.  Smoking cessation discussed.  Verbalizes understanding and is agreeable with plan. Pt is hemodynamically stable & in NAD prior to dc.   Final Clinical Impressions(s) / ED Diagnoses   Final diagnoses:  Viral URI with cough  Current smoker    New  Prescriptions New Prescriptions   BENZONATATE (TESSALON) 100 MG CAPSULE    Take 1 capsule (100 mg total) by mouth every 8 (eight) hours.   FLUTICASONE (FLONASE) 50 MCG/ACT NASAL SPRAY    Place 2 sprays into both nostrils daily.    I personally performed the services described in this documentation, which was scribed in my presence. The recorded information has been reviewed and is accurate.    Dahlia Client Giordano Getman, PA-C 08/08/16 1749    Rolland Porter, MD 08/21/16 [phone]

## 2016-08-08 NOTE — ED Triage Notes (Signed)
Pt presents with c/o cough. Pt reports she woke up this past Saturday with a sore throat. Pt reports that on Sunday she started to have some nasal congestion and is now having a cough that is not clearing up. Pt reports today she was coughing up green sputum. Pt denies chest pain at rest but reports her chest hurts when she takes a deep breath. NAD.

## 2016-08-08 NOTE — Discharge Instructions (Signed)
1. Medications: flonase, OTC mucinex, tessalon, albuterol, usual home medications °2. Treatment: rest, drink plenty of fluids, take tylenol or ibuprofen for fever control °3. Follow Up: Please followup with your primary doctor in 3 days for discussion of your diagnoses and further evaluation after today's visit; if you do not have a primary care doctor use the resource guide provided to find one; Return to the ER for high fevers, difficulty breathing or other concerning symptoms ° °

## 2016-11-24 ENCOUNTER — Emergency Department (HOSPITAL_COMMUNITY)
Admission: EM | Admit: 2016-11-24 | Discharge: 2016-11-24 | Disposition: A | Discharge status: Home / Self Care | Payer: Self-pay | Attending: Emergency Medicine | Admitting: Emergency Medicine

## 2016-11-24 ENCOUNTER — Encounter (HOSPITAL_COMMUNITY): Payer: Self-pay | Admitting: Emergency Medicine

## 2016-11-24 DIAGNOSIS — Y939 Activity, unspecified: Secondary | ICD-10-CM | POA: Insufficient documentation

## 2016-11-24 DIAGNOSIS — Y929 Unspecified place or not applicable: Secondary | ICD-10-CM | POA: Insufficient documentation

## 2016-11-24 DIAGNOSIS — F17210 Nicotine dependence, cigarettes, uncomplicated: Secondary | ICD-10-CM | POA: Insufficient documentation

## 2016-11-24 DIAGNOSIS — S025XXA Fracture of tooth (traumatic), initial encounter for closed fracture: Secondary | ICD-10-CM | POA: Insufficient documentation

## 2016-11-24 DIAGNOSIS — X58XXXA Exposure to other specified factors, initial encounter: Secondary | ICD-10-CM | POA: Insufficient documentation

## 2016-11-24 DIAGNOSIS — Z79899 Other long term (current) drug therapy: Secondary | ICD-10-CM | POA: Insufficient documentation

## 2016-11-24 DIAGNOSIS — Y999 Unspecified external cause status: Secondary | ICD-10-CM | POA: Insufficient documentation

## 2016-11-24 MED ORDER — AMOXICILLIN 500 MG PO CAPS: 500.0000 mg | ORAL_CAPSULE | Freq: Three times a day (TID) | ORAL | 0 refills | Status: DC

## 2016-11-24 MED ORDER — DICLOFENAC SODIUM 75 MG PO TBEC: 75.0000 mg | DELAYED_RELEASE_TABLET | Freq: Two times a day (BID) | ORAL | 0 refills | Status: DC

## 2016-11-24 NOTE — Discharge Instructions (Signed)
Schedule dental treatment

## 2016-11-24 NOTE — ED Provider Notes (Signed)
[facility]-EMERGENCY DEPT Provider Note   CSN: [id number] Arrival date & time: 11/24/16  0227     History   Chief Complaint Chief Complaint  Patient presents with  . Dental Pain    HPI Jamie Avery is a 31 y.o. female.  The history is provided by the patient. No language interpreter was used.  Dental Pain   This is a new problem. Episode onset: 2 months. The problem occurs constantly. The problem has been gradually worsening. The pain is moderate. She has tried nothing for the symptoms. The treatment provided no relief.  Pt complains of pain in her lower gum.  Pt reports problems for 2 months but tooth broke 2 days ago.  Pt thinsk she has an infection  Past Medical History:  Diagnosis Date  . Anxiety   . Depression     There are no active problems to display for this patient.   History reviewed. No pertinent surgical history.  OB History    No data available       Home Medications    Prior to Admission medications   Medication Sig Start Date End Date Taking? Authorizing Provider  amoxicillin (AMOXIL) 500 MG capsule Take 1 capsule (500 mg total) by mouth 3 (three) times daily. 11/24/16   Elson AreasSofia, Sujata Maines K, PA-C  benzocaine-menthol (CHLORAEPTIC) 6-10 MG lozenge Take 1 lozenge by mouth as needed for sore throat. 05/02/16   Janne NapoleonNeese, Hope M, NP  benzonatate (TESSALON) 100 MG capsule Take 1 capsule (100 mg total) by mouth every 8 (eight) hours. 08/08/16   Muthersbaugh, Dahlia ClientHannah, PA-C  citalopram (CELEXA) 20 MG tablet Take 20 mg by mouth daily. Reported on 11/08/2015    [provider]  cyclobenzaprine (FLEXERIL) 10 MG tablet Take 1 tablet (10 mg total) by mouth 3 (three) times daily as needed for muscle spasms. 11/08/15   Trixie DredgeWest, Emily, PA-C  diclofenac (VOLTAREN) 75 MG EC tablet Take 1 tablet (75 mg total) by mouth 2 (two) times daily. 11/24/16   Elson AreasSofia, Salle Brandle K, PA-C  diphenoxylate-atropine (LOMOTIL) 2.5-0.025 MG per tablet Take 2 tablets by mouth 4 (four) times daily as needed for  diarrhea or loose stools. Patient not taking: Reported on 11/08/2015 01/03/15   Gilda CreasePollina, Christopher J, MD  fluticasone [facility](FLONASE) 50 MCG/ACT nasal spray Place 2 sprays into both nostrils daily. 08/08/16   Muthersbaugh, Dahlia ClientHannah, PA-C  ibuprofen (ADVIL,MOTRIN) 600 MG tablet Take 1 tablet (600 mg total) by mouth every 8 (eight) hours as needed. 11/08/15   Trixie DredgeWest, Emily, PA-C  traZODone (DESYREL) 50 MG tablet Take 100 mg by mouth at bedtime. Reported on 11/08/2015    [provider]    Family History History reviewed. No pertinent family history.  Social History Social History  Substance Use Topics  . Smoking status: Current Every Day Smoker    Packs/day: 0.50    Types: Cigarettes  . Smokeless tobacco: Never Used  . Alcohol use No     Allergies   Patient has no known allergies.   Review of Systems Review of Systems  All other systems reviewed and are negative.    Physical Exam Updated Vital Signs BP (!) 164/97 (BP Location: Right Arm)   Pulse 82   Temp 98 F (36.7 C) (Oral)   Resp 18   Ht 5\' 6"  (1.676 m)   Wt 90.7 kg (200 lb)   SpO2 97%   BMI 32.28 kg/m   Physical Exam  Constitutional: She appears well-developed and well-nourished.  Cardiovascular: Normal rate and regular rhythm.  Pulmonary/Chest: Effort normal.  Neurological: She is alert.  Skin: Skin is warm.  Psychiatric: She has a normal mood and affect.     ED Treatments / Results  Labs (all labs ordered are listed, but only abnormal results are displayed) Labs Reviewed - No data to display  EKG  EKG Interpretation None       Radiology No results found.  Procedures Procedures (including critical care time)  Medications Ordered in ED Medications - No data to display   Initial Impression / Assessment and Plan / ED Course  I have reviewed the triage vital signs and the nursing notes.  Pertinent labs & imaging results that were available during my care of the patient were reviewed by me  and considered in my medical decision making (see chart for details).       Final Clinical Impressions(s) / ED Diagnoses   Final diagnoses:  Closed fracture of tooth, initial encounter    New Prescriptions Discharge Medication List as of 11/24/2016  6:35 AM    START taking these medications   Details  amoxicillin (AMOXIL) 500 MG capsule Take 1 capsule (500 mg total) by mouth 3 (three) times daily., Starting Sun 11/24/2016, Print    diclofenac (VOLTAREN) 75 MG EC tablet Take 1 tablet (75 mg total) by mouth 2 (two) times daily., Starting Sun 11/24/2016, Print      An After Visit Summary was printed and given to the patient.   Elson Areas, PA-C 11/24/16 1534    Shaune Pollack, MD 11/25/16 [phone]

## 2016-11-24 NOTE — ED Triage Notes (Signed)
Patient complaining of dental pain that started two months. Patient states that it hurts on the bottom right.

## 2018-02-08 IMAGING — CT CT HEAD W/O CM
5 of 8 series · 16 of 47 positions shown, 18 images · non-contrast
Comparison: None.

CLINICAL DATA: Sudden onset of neck pain today head injury at work
earlier today. Dizziness with double vision.

EXAM:
CT HEAD WITHOUT CONTRAST
CT CERVICAL SPINE WITHOUT CONTRAST
TECHNIQUE: Multidetector CT imaging of the head and cervical spine was
performed following the standard protocol without intravenous
contrast. Multiplanar CT image reconstructions of the cervical spine
were also generated.

[Series 3: head w/o · axial · non-contrast · 0.40mm/px · z∈[-85,-30]mm · 2 of 33 slices shown]
[im 11/33  brain]
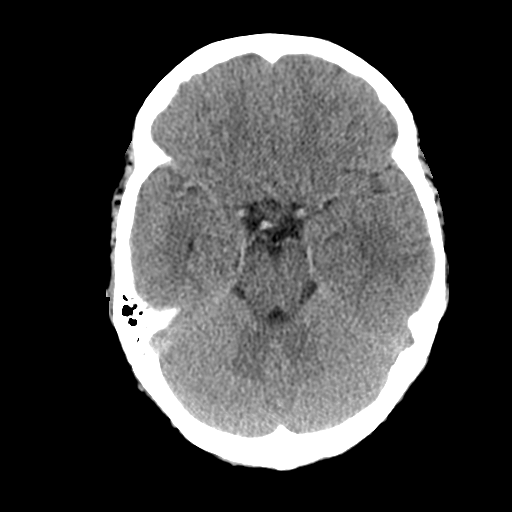
[im 22/33  brain]
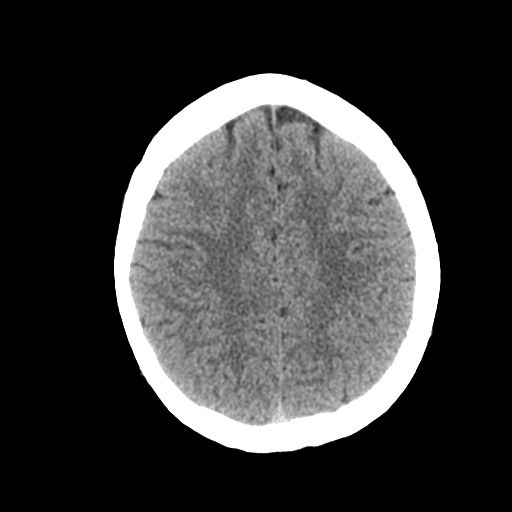

[Series 4: bone windows · axial · 0.40mm/px · 1 of 54 slices shown]
[im 11/54  bone]
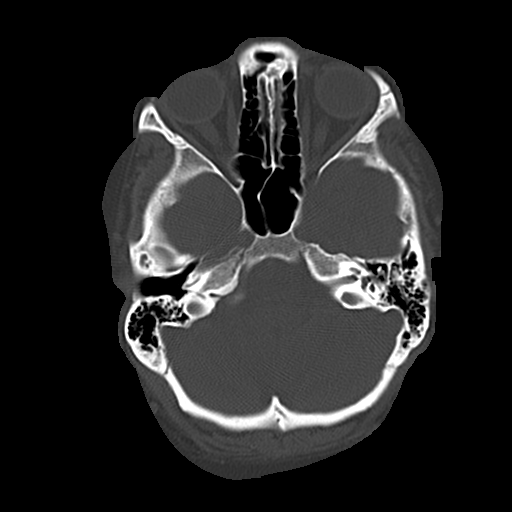

[Series 6: coronal · coronal · 0.29mm/px · 3 of 62 slices shown]
[im 23/62  brain]
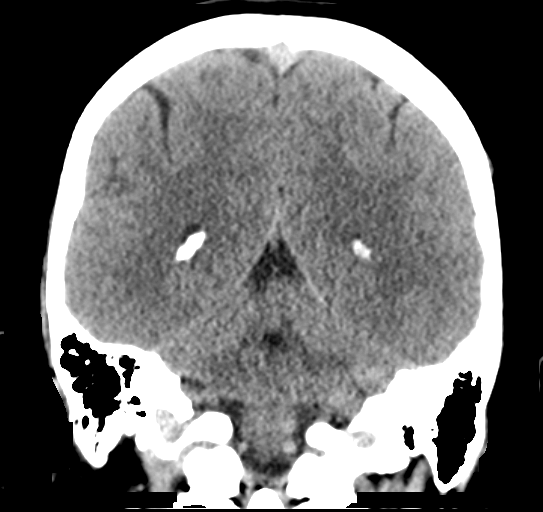
[im 31/62  brain]
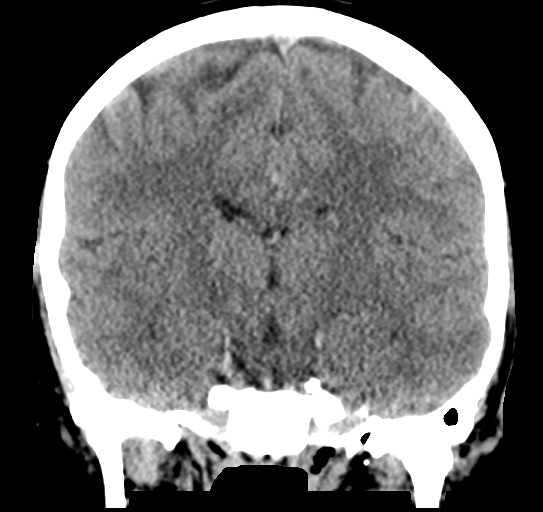
[im 39/62  brain]
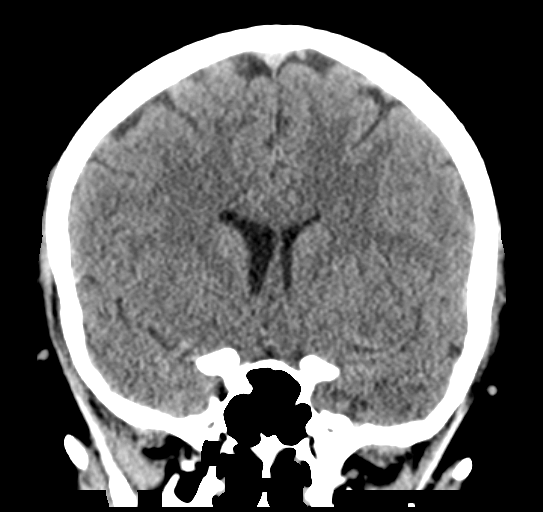

[Series 11: axial recon · axial · 0.23mm/px · z∈[-287,-164]mm · 8 of 84 slices shown, 10 images]
[im 10/84  brain]
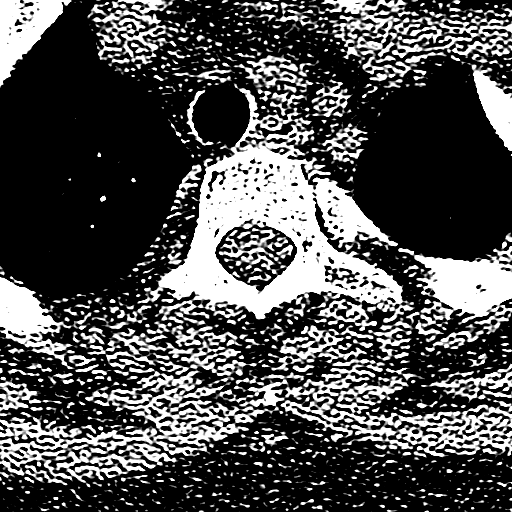
[im 10/84  bone]
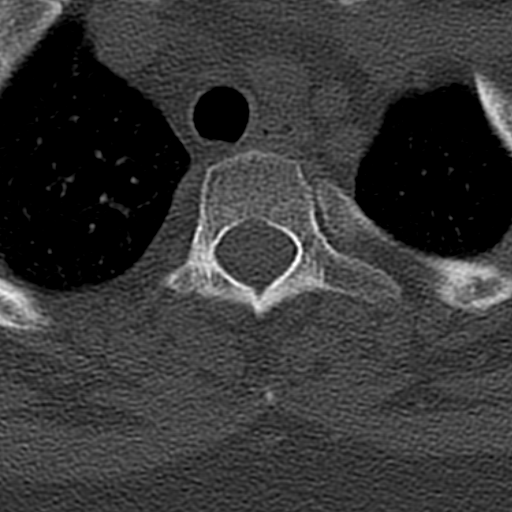
[im 19/84  brain]
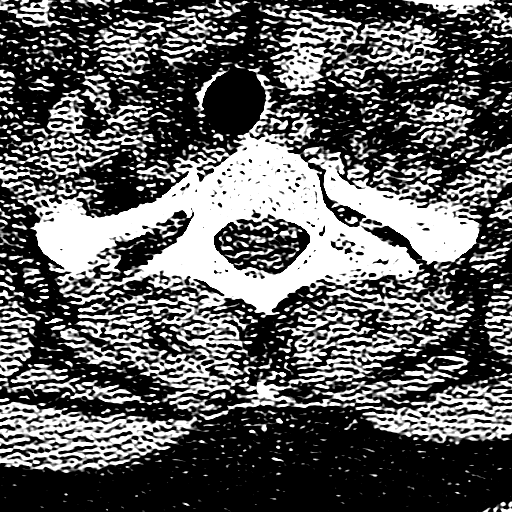
[im 28/84  brain]
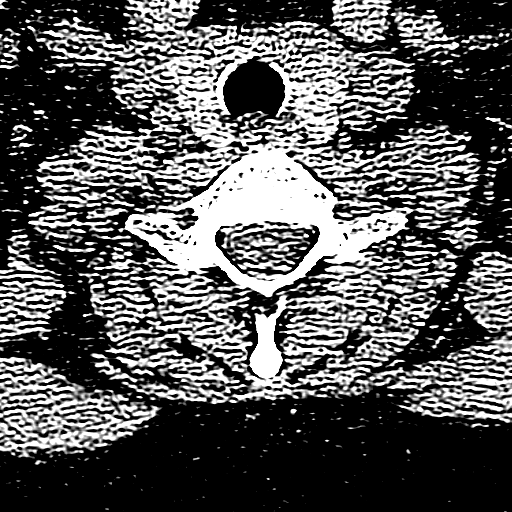
[im 37/84  brain]
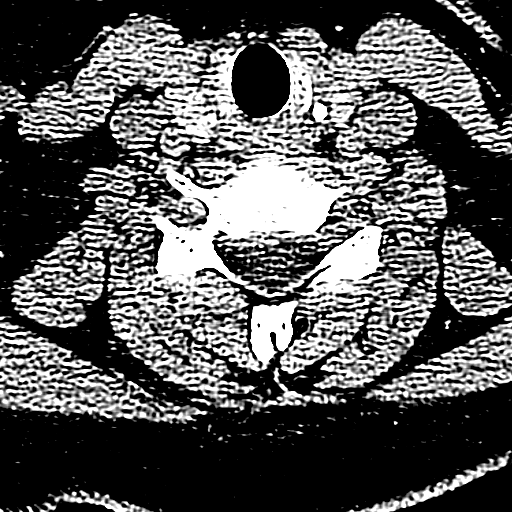
[im 47/84  brain]
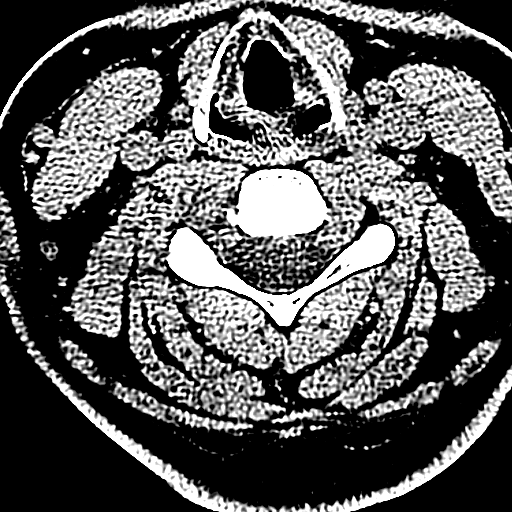
[im 47/84  bone]
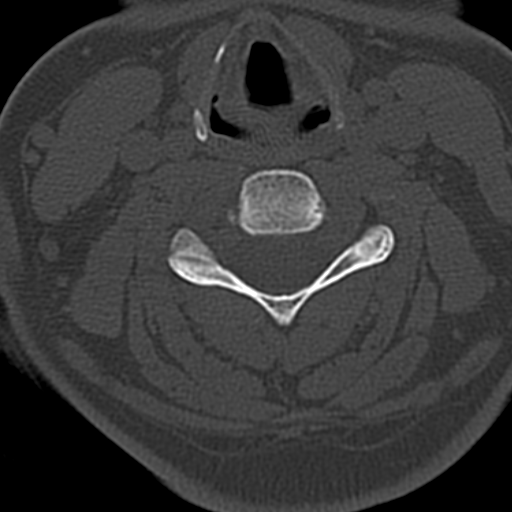
[im 56/84  brain]
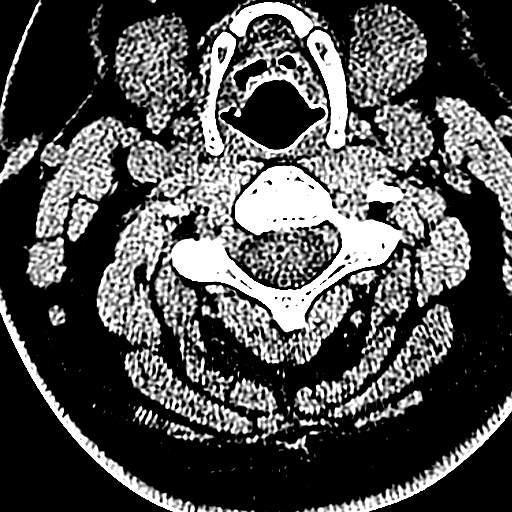
[im 65/84  brain]
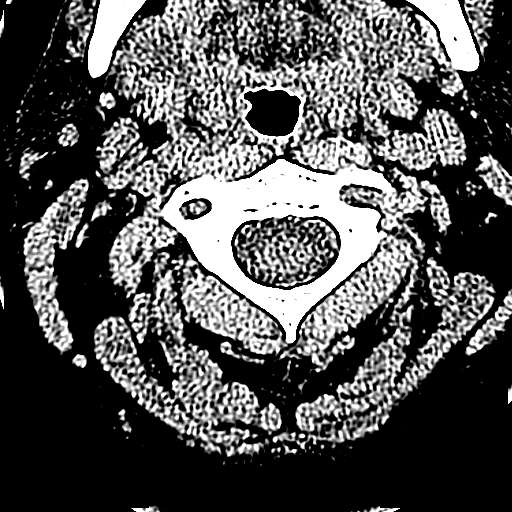
[im 74/84  brain]
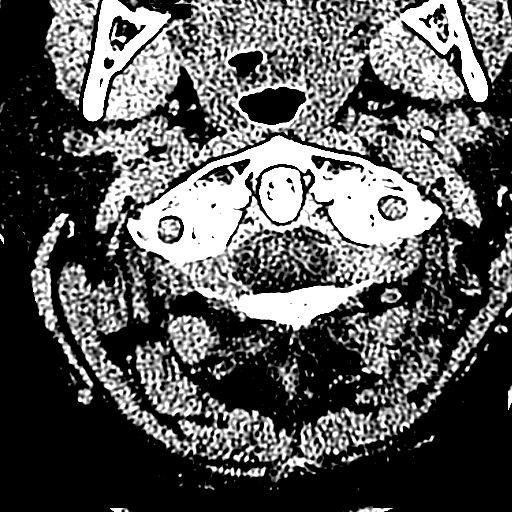

[Series 13: sagittal · sagittal · 0.27mm/px · 2 of 61 slices shown]
[im 21/61  brain]
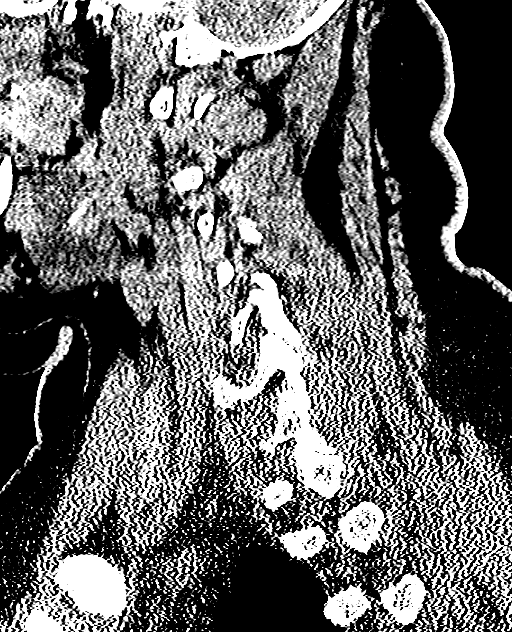
[im 41/61  brain]
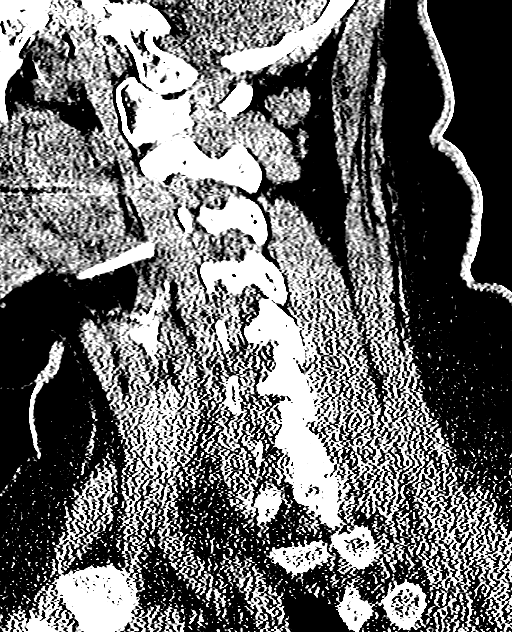

[16 of 47 positions shown; findings below may reference images not displayed]

FINDINGS: CT HEAD FINDINGS

Brain: There is no evidence of acute intracranial hemorrhage, mass
lesion, brain edema or extra-axial fluid collection. The ventricles
and subarachnoid spaces are appropriately sized for age. There is no
CT evidence of acute cortical infarction.

Bones/sinuses/visualized face: The visualized paranasal sinuses,
mastoid air cells and middle ears are clear. The calvarium is
intact.

CT CERVICAL SPINE FINDINGS

The cervical spine demonstrates mild straightening. There is no
evidence of acute fracture or traumatic subluxation. The disc spaces
are preserved. No acute soft tissue findings are seen.
IMPRESSION: 1. Negative noncontrast head CT.
2. No evidence of acute cervical spine fracture, traumatic
subluxation or static signs of instability. No significant
spondylosis.

## 2019-03-19 DIAGNOSIS — Y990 Civilian activity done for income or pay: Secondary | ICD-10-CM | POA: Diagnosis not present

## 2019-03-19 DIAGNOSIS — W230XXA Caught, crushed, jammed, or pinched between moving objects, initial encounter: Secondary | ICD-10-CM | POA: Diagnosis not present

## 2019-03-19 DIAGNOSIS — F17210 Nicotine dependence, cigarettes, uncomplicated: Secondary | ICD-10-CM | POA: Diagnosis not present

## 2019-03-19 DIAGNOSIS — S9782XA Crushing injury of left foot, initial encounter: Secondary | ICD-10-CM | POA: Diagnosis not present

## 2019-03-19 DIAGNOSIS — S99922A Unspecified injury of left foot, initial encounter: Secondary | ICD-10-CM | POA: Diagnosis present

## 2019-03-19 DIAGNOSIS — Z79899 Other long term (current) drug therapy: Secondary | ICD-10-CM | POA: Diagnosis not present

## 2019-03-19 DIAGNOSIS — Y939 Activity, unspecified: Secondary | ICD-10-CM | POA: Diagnosis not present

## 2019-03-19 DIAGNOSIS — Y929 Unspecified place or not applicable: Secondary | ICD-10-CM | POA: Diagnosis not present

## 2019-03-19 NOTE — ED Triage Notes (Signed)
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 Reports 9/10 pain, pedal pulses and circulation intact.

## 2019-03-20 ENCOUNTER — Emergency Department (HOSPITAL_COMMUNITY)
Admission: EM | Admit: 2019-03-20 | Discharge: 2019-03-20 | Disposition: A | Discharge status: Home / Self Care | Payer: No Typology Code available for payment source | Attending: Emergency Medicine | Admitting: Emergency Medicine

## 2019-03-20 ENCOUNTER — Other Ambulatory Visit (HOSPITAL_COMMUNITY): Payer: Self-pay

## 2019-03-20 ENCOUNTER — Emergency Department (HOSPITAL_COMMUNITY): Payer: Self-pay | Attending: Emergency Medicine

## 2019-03-20 DIAGNOSIS — S9782XA Crushing injury of left foot, initial encounter: Secondary | ICD-10-CM

## 2019-03-20 MED ORDER — ACETAMINOPHEN 500 MG PO TABS
1000.0000 mg | ORAL_TABLET | Freq: Once | ORAL | Status: AC
  Administered 2019-03-20: 03:00:00 1000 mg via ORAL
  Filled 2019-03-20: qty 2

## 2019-03-20 NOTE — Discharge Instructions (Addendum)
Thank you for allowing me to care for you today in the Emergency Department.   Wear the Ace wrap to provide compression to help with pain on your foot.  Apply ice packs for 15 to 20 minutes as frequently as needed to help with pain and swelling.  When you are sitting, try to elevate your left foot so that your toes are at or above the level of your nose to help with pain and swelling.  Take 650 mg of Tylenol or 600 mg of ibuprofen with food every 6 hours for pain.  You can alternate between these 2 medications every 3 hours if your pain returns.  For instance, you can take Tylenol at noon, followed by a dose of ibuprofen at 3, followed by second dose of Tylenol and 6.  Use the crutches until you can put weight on your left foot without significant pain.  If your pain is not significantly improved by Wednesday when you need to return to work, call to schedule a follow-up appointment with orthopedics.  Return to the emergency department if you develop severe swelling in your lower leg, if your toes turn blue, if you have any fall or injury, or develop other new, concerning symptoms.

## 2019-03-20 NOTE — ED Notes (Signed)
Held patient's discharge until ED lockdown over.

## 2019-03-20 NOTE — ED Provider Notes (Signed)
[facility]-EMERGENCY DEPT Provider Note   CSN: [id number] Arrival date & time: 03/19/19  2336     History   Chief Complaint No chief complaint on file.   HPI Jamie Avery is a 33 y.o. female with history of anxiety and depression who presents to the emergency department with a chief complaint of left foot injury that occurred earlier tonight.  The patient reports that she was at work when her foot became caught between a pallet and the loading ramp of a truck.  She reports 9 out of 10 pain to the top of the left foot.  She reports that she took ibuprofen prior to arrival.  Other treatment prior to arrival includes an Ace wrap to the area.  She reports that she has not attempted to walk or put weight on the foot since the injury.  She denies numbness, weakness, fever, chills, left ankle pain, or left knee pain.  No history of left ankle or foot surgery.  She reports that she is currently in recovery and does not take narcotic medications.     The history is provided by the patient. No language interpreter was used.    Past Medical History:  Diagnosis Date   Anxiety    Depression     There are no active problems to display for this patient.   No past surgical history on file.   OB History   No obstetric history on file.      Home Medications    Prior to Admission medications   Medication Sig Start Date End Date Taking? Authorizing Provider  amoxicillin (AMOXIL) 500 MG capsule Take 1 capsule (500 mg total) by mouth 3 (three) times daily. 11/24/16   Elson Areas, PA-C  benzocaine-menthol (CHLORAEPTIC) 6-10 MG lozenge Take 1 lozenge by mouth as needed for sore throat. 05/02/16   Janne Napoleon, NP  benzonatate (TESSALON) 100 MG capsule Take 1 capsule (100 mg total) by mouth every 8 (eight) hours. 08/08/16   Muthersbaugh, Dahlia Client, PA-C  citalopram (CELEXA) 20 MG tablet Take 20 mg by mouth daily. Reported on 11/08/2015    [provider]    cyclobenzaprine (FLEXERIL) 10 MG tablet Take 1 tablet (10 mg total) by mouth 3 (three) times daily as needed for muscle spasms. 11/08/15   Trixie Dredge, PA-C  diclofenac (VOLTAREN) 75 MG EC tablet Take 1 tablet (75 mg total) by mouth 2 (two) times daily. 11/24/16   Elson Areas, PA-C  diphenoxylate-atropine (LOMOTIL) 2.5-0.025 MG per tablet Take 2 tablets by mouth 4 (four) times daily as needed for diarrhea or loose stools. Patient not taking: Reported on 11/08/2015 01/03/15   Gilda Crease, MD  fluticasone [facility]) 50 MCG/ACT nasal spray Place 2 sprays into both nostrils daily. 08/08/16   Muthersbaugh, Dahlia Client, PA-C  ibuprofen (ADVIL,MOTRIN) 600 MG tablet Take 1 tablet (600 mg total) by mouth every 8 (eight) hours as needed. 11/08/15   Trixie Dredge, PA-C  traZODone (DESYREL) 50 MG tablet Take 100 mg by mouth at bedtime. Reported on 11/08/2015    [provider]    Family History No family history on file.  Social History Social History   Tobacco Use   Smoking status: Current Every Day Smoker    Packs/day: 0.50    Types: Cigarettes   Smokeless tobacco: Never Used  Substance Use Topics   Alcohol use: No   Drug use: No     Allergies   Patient has no known allergies.  Review of Systems Review of Systems  Constitutional: Negative for activity change.  Respiratory: Negative for shortness of breath.   Cardiovascular: Negative for chest pain.  Gastrointestinal: Negative for abdominal pain.  Genitourinary: Negative for dysuria.  Musculoskeletal: Positive for arthralgias, gait problem and myalgias. Negative for back pain, joint swelling, neck pain and neck stiffness.  Skin: Negative for rash.  Allergic/Immunologic: Negative for immunocompromised state.  Neurological: Negative for seizures, syncope, weakness, light-headedness and headaches.  Psychiatric/Behavioral: Negative for confusion.     Physical Exam Updated Vital Signs BP (!) 168/99    Pulse 70    Temp  98.1 F (36.7 C) (Oral)    Resp 16    Ht 5\' 6"  (1.676 m)    Wt 94.8 kg    SpO2 95%    BMI 33.73 kg/m   Physical Exam Vitals signs and nursing note reviewed.  Constitutional:      General: She is not in acute distress. HENT:     Head: Normocephalic.  Eyes:     Conjunctiva/sclera: Conjunctivae normal.  Neck:     Musculoskeletal: Neck supple.  Cardiovascular:     Rate and Rhythm: Normal rate and regular rhythm.     Heart sounds: No murmur. No friction rub. No gallop.   Pulmonary:     Effort: Pulmonary effort is normal. No respiratory distress.  Abdominal:     General: There is no distension.     Palpations: Abdomen is soft.  Musculoskeletal:        General: Tenderness present.     Right lower leg: No edema.     Left lower leg: No edema.     Comments: Tenderness to palpation to the dorsum of the left foot.  Full active and passive range of motion of the left ankle.  No tenderness to the medial or lateral malleolus.  Achilles tendon is intact and unremarkable.  Good strength against resistance with dorsiflexion and plantarflexion despite pain.  Independently moves all digits of the bilateral feet.  DP and PT pulses are 2+ and symmetric.  Patient is able to bear weight on the bilateral lower extremities, but it is painful.  There is overlying bruising and contusion noted to the dorsum of the left foot.  Skin:    General: Skin is warm.     Findings: No rash.  Neurological:     Mental Status: She is alert.  Psychiatric:        Behavior: Behavior normal.      ED Treatments / Results  Labs (all labs ordered are listed, but only abnormal results are displayed) Labs Reviewed - No data to display  EKG None  Radiology Dg Ankle Complete Left  Result Date: 03/20/2019 CLINICAL DATA:  Pain EXAM: LEFT ANKLE COMPLETE - 3+ VIEW COMPARISON:  None. FINDINGS: There is no evidence of fracture, dislocation, or joint effusion. There is no evidence of arthropathy or other focal bone  abnormality. Soft tissues are unremarkable. There is a moderate-sized plantar calcaneal spur. IMPRESSION: Negative. Electronically Signed   By: Constance Holster M.D.   On: 03/20/2019 00:20   Dg Foot Complete Left  Result Date: 03/20/2019 CLINICAL DATA:  Pain EXAM: LEFT FOOT - COMPLETE 3+ VIEW COMPARISON:  None. FINDINGS: There is no evidence of fracture or dislocation. There is no evidence of arthropathy or other focal bone abnormality. Soft tissues are unremarkable. IMPRESSION: Negative. Electronically Signed   By: Constance Holster M.D.   On: 03/20/2019 00:22    Procedures Procedures (including critical care  time)  Medications Ordered in ED Medications  acetaminophen (TYLENOL) tablet 1,000 mg (1,000 mg Oral Given 03/20/19 0326)     Initial Impression / Assessment and Plan / ED Course  I have reviewed the triage vital signs and the nursing notes.  Pertinent labs & imaging results that were available during my care of the patient were reviewed by me and considered in my medical decision making (see chart for details).        Patient X-Ray negative for obvious fracture or dislocation.  She is neurovascularly intact.  Tylenol given for pain.  Pain managed in ED. Pt advised to follow up with orthopedics if symptoms persist for possibility of missed fracture diagnosis. Patient given brace while in ED, conservative therapy recommended and discussed. Patient will be dc home & is agreeable with above plan.   Final Clinical Impressions(s) / ED Diagnoses   Final diagnoses:  Crushing injury of left foot, initial encounter    ED Discharge Orders         Ordered    Apply wrap     03/20/19 0251    Crutches     03/20/19 0251           Kavon Valenza A, PA-C 03/20/19 0542    Devoria AlbeKnapp, Iva, MD 03/20/19 [phone]

## 2019-03-31 ENCOUNTER — Emergency Department (HOSPITAL_COMMUNITY): Payer: Self-pay

## 2019-03-31 ENCOUNTER — Other Ambulatory Visit: Payer: Self-pay

## 2019-03-31 ENCOUNTER — Encounter (HOSPITAL_COMMUNITY): Payer: Self-pay

## 2019-03-31 ENCOUNTER — Emergency Department (HOSPITAL_COMMUNITY)
Admission: EM | Admit: 2019-03-31 | Discharge: 2019-03-31 | Disposition: A | Discharge status: Home / Self Care | Payer: Self-pay | Attending: Emergency Medicine | Admitting: Emergency Medicine

## 2019-03-31 DIAGNOSIS — M79672 Pain in left foot: Secondary | ICD-10-CM | POA: Insufficient documentation

## 2019-03-31 DIAGNOSIS — W231XXD Caught, crushed, jammed, or pinched between stationary objects, subsequent encounter: Secondary | ICD-10-CM | POA: Insufficient documentation

## 2019-03-31 DIAGNOSIS — F17210 Nicotine dependence, cigarettes, uncomplicated: Secondary | ICD-10-CM | POA: Insufficient documentation

## 2019-03-31 MED ORDER — KETOROLAC TROMETHAMINE 15 MG/ML IJ SOLN
15.0000 mg | Freq: Once | INTRAMUSCULAR | Status: AC
  Administered 2019-03-31: 10:00:00 15 mg via INTRAMUSCULAR
  Filled 2019-03-31: qty 1

## 2019-03-31 NOTE — ED Provider Notes (Signed)
[facility] DEPT Provider Note   CSN: [id number] Arrival date & time: 03/31/19  0849     History   Chief Complaint Chief Complaint  Patient presents with  . Foot Pain    HPI Jamie Avery is a 33 y.o. female presenting for evaluation of left foot pain.  Patient states 2 weeks ago she got her foot stuck between a pallet and loading ramp.  She was seen in the ED, no fracture dislocation noted at that time.  She has been following up with Worker's Comp doctor, was cleared to go back to work today.  Patient reports continued severe pain, is concerned that there might be something wrong with the bones.  She denies numbness or tingling.  She has not followed up with orthopedics.  She has been taking Tylenol and ibuprofen with short-lived improvement of pain before it returns.     HPI  Past Medical History:  Diagnosis Date  . Anxiety   . Depression     There are no active problems to display for this patient.   Past Surgical History:  Procedure Laterality Date  . ADENOIDECTOMY       OB History   No obstetric history on file.      Home Medications    Prior to Admission medications   Medication Sig Start Date End Date Taking? Authorizing Provider  amoxicillin (AMOXIL) 500 MG capsule Take 1 capsule (500 mg total) by mouth 3 (three) times daily. 11/24/16   Fransico Meadow, PA-C  benzocaine-menthol (CHLORAEPTIC) 6-10 MG lozenge Take 1 lozenge by mouth as needed for sore throat. 05/02/16   Ashley Murrain, NP  benzonatate (TESSALON) 100 MG capsule Take 1 capsule (100 mg total) by mouth every 8 (eight) hours. 08/08/16   Muthersbaugh, Jarrett Soho, PA-C  citalopram (CELEXA) 20 MG tablet Take 20 mg by mouth daily. Reported on 11/08/2015    [provider]  cyclobenzaprine (FLEXERIL) 10 MG tablet Take 1 tablet (10 mg total) by mouth 3 (three) times daily as needed for muscle spasms. 11/08/15   Clayton Bibles, PA-C  diclofenac (VOLTAREN) 75 MG EC tablet Take  1 tablet (75 mg total) by mouth 2 (two) times daily. 11/24/16   Fransico Meadow, PA-C  diphenoxylate-atropine (LOMOTIL) 2.5-0.025 MG per tablet Take 2 tablets by mouth 4 (four) times daily as needed for diarrhea or loose stools. Patient not taking: Reported on 11/08/2015 01/03/15   Orpah Greek, MD  fluticasone [facility]) 50 MCG/ACT nasal spray Place 2 sprays into both nostrils daily. 08/08/16   Muthersbaugh, Jarrett Soho, PA-C  ibuprofen (ADVIL,MOTRIN) 600 MG tablet Take 1 tablet (600 mg total) by mouth every 8 (eight) hours as needed. 11/08/15   Clayton Bibles, PA-C  traZODone (DESYREL) 50 MG tablet Take 100 mg by mouth at bedtime. Reported on 11/08/2015    [provider]    Family History Family History  Family history unknown: Yes    Social History Social History   Tobacco Use  . Smoking status: Current Every Day Smoker    Packs/day: 0.50    Types: Cigarettes  . Smokeless tobacco: Never Used  Substance Use Topics  . Alcohol use: No  . Drug use: No     Allergies   Patient has no known allergies.   Review of Systems Review of Systems  Musculoskeletal: Positive for arthralgias.  Neurological: Negative for numbness.     Physical Exam Updated Vital Signs BP (!) 146/89   Pulse 76   Temp 98.6  F (37 C) (Oral)   Resp 16   Ht 5\' 6"  (1.676 m)   Wt 95.3 kg   LMP 01/29/2019 (Approximate)   SpO2 98%   BMI 33.89 kg/m   Physical Exam Vitals signs and nursing note reviewed.  Constitutional:      General: She is not in acute distress.    Appearance: She is well-developed.     Comments: Appears nontoxic  HENT:     Head: Normocephalic and atraumatic.  Neck:     Musculoskeletal: Normal range of motion.  Pulmonary:     Effort: Pulmonary effort is normal.  Abdominal:     General: There is no distension.  Musculoskeletal: Normal range of motion.        General: Tenderness present.     Comments: Tenderness palpation of the dorsal left foot.  Full active range of  motion of the ankle, increased pain with dorsiflexion.  Full active range of motion of the toes, increased pain with dorsiflexion.  Pedal pulses intact bilaterally.  Good distal cap refill.  No tenderness palpation of the calf or lower leg.  Skin:    General: Skin is warm.     Capillary Refill: Capillary refill takes less than 2 seconds.     Findings: No rash.  Neurological:     Mental Status: She is alert and oriented to person, place, and time.      ED Treatments / Results  Labs (all labs ordered are listed, but only abnormal results are displayed) Labs Reviewed - No data to display  EKG None  Radiology Dg Foot Complete Left  Result Date: 03/31/2019 CLINICAL DATA:  Left foot pain over the dorsum of the foot after crush injury. EXAM: LEFT FOOT - COMPLETE 3+ VIEW COMPARISON:  03/20/2019 FINDINGS: No signs of fracture or dislocation. No significant soft tissue swelling. The signs of plantar enthesopathy and midfoot degenerative change. IMPRESSION: No acute abnormalities. Electronically Signed   By: 03/22/2019 M.D.   On: 03/31/2019 10:04    Procedures Procedures (including critical care time)  Medications Ordered in ED Medications  ketorolac (TORADOL) 15 MG/ML injection 15 mg (15 mg Intramuscular Given 03/31/19 1026)     Initial Impression / Assessment and Plan / ED Course  I have reviewed the triage vital signs and the nursing notes.  Pertinent labs & imaging results that were available during my care of the patient were reviewed by me and considered in my medical decision making (see chart for details).        Patient presenting for evaluation of continued left foot pain.  Physical exam reassuring, she is neurovascularly intact.  X-rays from 2 weeks ago reviewed.  However, will repeat x-rays as patient has continued severe pain to ensure no missed fracture.  X-rays read interpreted by me, no fracture or dislocation.  As pain is worse with dorsiflexion of both ankles  and toes, likely ligamentous/tendon injury.  Discussed continued supportive treatment and follow-up with orthopedics.  At this time, patient appears safe for discharge.  Return precautions given.  Patient states she understands and agrees to plan.  Final Clinical Impressions(s) / ED Diagnoses   Final diagnoses:  Left foot pain    ED Discharge Orders    None       13/11/20, PA-C 03/31/19 1128    13/11/20, MD 03/31/19 1540

## 2019-03-31 NOTE — ED Triage Notes (Signed)
Patient states she injured her foot 2 weeks ago at work. Patient states she took her left boot off 2 days ago and immediately had increased pain. Patient is currently wearing her orthopedic boot. Patient states she has been seeing a worker's comp physician and said she left very upset after her visit yesterday. Patient states that physician stated that she should return t work today. Patient states she is having too much pain to go back to work.

## 2019-03-31 NOTE — Discharge Instructions (Signed)
Your x-ray today was negative. Follow-up with orthopedics for further evaluation management of your foot pain. Continue to use Tylenol ibuprofen as needed for pain control.  You may increase your ibuprofen dose to 600 mg up to 4 times a day. Return to the emergency room with any new, worsening, concerning symptoms.

## 2019-04-06 ENCOUNTER — Ambulatory Visit: Payer: Medicaid Other | Admitting: Orthopaedic Surgery

## 2019-04-25 ENCOUNTER — Other Ambulatory Visit: Payer: Self-pay

## 2019-04-25 ENCOUNTER — Encounter (HOSPITAL_COMMUNITY): Payer: Self-pay | Admitting: Behavioral Health

## 2019-04-25 ENCOUNTER — Inpatient Hospital Stay (HOSPITAL_COMMUNITY)
Admission: AD | Admit: 2019-04-25 | Discharge: 2019-04-28 | DRG: 885 | Disposition: A | Discharge status: Home / Self Care | Payer: BLUE CROSS/BLUE SHIELD | Attending: Psychiatry | Admitting: Psychiatry

## 2019-04-25 DIAGNOSIS — F1121 Opioid dependence, in remission: Secondary | ICD-10-CM | POA: Diagnosis present

## 2019-04-25 DIAGNOSIS — F419 Anxiety disorder, unspecified: Secondary | ICD-10-CM | POA: Diagnosis present

## 2019-04-25 DIAGNOSIS — Z20828 Contact with and (suspected) exposure to other viral communicable diseases: Secondary | ICD-10-CM | POA: Diagnosis present

## 2019-04-25 DIAGNOSIS — F319 Bipolar disorder, unspecified: Principal | ICD-10-CM | POA: Diagnosis present

## 2019-04-25 DIAGNOSIS — Z79899 Other long term (current) drug therapy: Secondary | ICD-10-CM | POA: Diagnosis not present

## 2019-04-25 DIAGNOSIS — Z915 Personal history of self-harm: Secondary | ICD-10-CM | POA: Diagnosis not present

## 2019-04-25 DIAGNOSIS — F17210 Nicotine dependence, cigarettes, uncomplicated: Secondary | ICD-10-CM | POA: Diagnosis present

## 2019-04-25 DIAGNOSIS — F332 Major depressive disorder, recurrent severe without psychotic features: Secondary | ICD-10-CM

## 2019-04-25 DIAGNOSIS — G4700 Insomnia, unspecified: Secondary | ICD-10-CM | POA: Diagnosis present

## 2019-04-25 DIAGNOSIS — F320 Major depressive disorder, single episode, mild: Secondary | ICD-10-CM

## 2019-04-25 DIAGNOSIS — R45851 Suicidal ideations: Secondary | ICD-10-CM | POA: Diagnosis present

## 2019-04-25 DIAGNOSIS — F329 Major depressive disorder, single episode, unspecified: Secondary | ICD-10-CM | POA: Diagnosis present

## 2019-04-25 LAB — SARS CORONAVIRUS 2 BY RT PCR (HOSPITAL ORDER, PERFORMED IN ~~LOC~~ HOSPITAL LAB): SARS Coronavirus 2: NEGATIVE

## 2019-04-25 MED ORDER — TRAZODONE HCL 100 MG PO TABS
100.0000 mg | ORAL_TABLET | Freq: Every evening | ORAL | Status: DC | PRN
  Filled 2019-04-25: qty 1

## 2019-04-25 MED ORDER — TRAZODONE HCL 50 MG PO TABS
50.0000 mg | ORAL_TABLET | Freq: Every evening | ORAL | Status: DC | PRN
  Filled 2019-04-25 (×2): qty 1

## 2019-04-25 MED ORDER — MAGNESIUM HYDROXIDE 400 MG/5ML PO SUSP
30.0000 mL | Freq: Every day | ORAL | Status: DC | PRN
  Filled 2019-04-25: qty 30

## 2019-04-25 MED ORDER — ACETAMINOPHEN 325 MG PO TABS
650.0000 mg | ORAL_TABLET | Freq: Four times a day (QID) | ORAL | Status: DC | PRN
  Administered 2019-04-28: 650 mg via ORAL
  Filled 2019-04-25 (×2): qty 2

## 2019-04-25 MED ORDER — FLUOXETINE HCL 20 MG PO CAPS
60.0000 mg | ORAL_CAPSULE | Freq: Every day | ORAL | Status: DC
  Administered 2019-04-26 – 2019-04-28 (×3): 60 mg via ORAL
  Filled 2019-04-25 (×5): qty 3

## 2019-04-25 MED ORDER — LURASIDONE HCL 40 MG PO TABS
40.0000 mg | ORAL_TABLET | Freq: Every day | ORAL | Status: DC
  Administered 2019-04-26 – 2019-04-28 (×3): 40 mg via ORAL
  Filled 2019-04-25 (×5): qty 1

## 2019-04-25 MED ORDER — ALUM & MAG HYDROXIDE-SIMETH 200-200-20 MG/5ML PO SUSP
30.0000 mL | ORAL | Status: DC | PRN
  Filled 2019-04-25: qty 30

## 2019-04-25 MED ORDER — NAPROXEN 500 MG PO TABS: 500.0000 mg | ORAL_TABLET | Freq: Two times a day (BID) | ORAL | Status: DC | PRN

## 2019-04-25 MED ORDER — FLUOXETINE HCL 20 MG PO CAPS
20.0000 mg | ORAL_CAPSULE | Freq: Every day | ORAL | Status: DC
  Filled 2019-04-25: qty 1

## 2019-04-25 MED ORDER — LAMOTRIGINE 100 MG PO TABS: 100.0000 mg | ORAL_TABLET | Freq: Two times a day (BID) | ORAL | Status: DC

## 2019-04-25 MED ORDER — METHOCARBAMOL 500 MG PO TABS
500.0000 mg | ORAL_TABLET | Freq: Three times a day (TID) | ORAL | Status: DC | PRN
  Filled 2019-04-25: qty 1

## 2019-04-25 MED ORDER — LOPERAMIDE HCL 2 MG PO CAPS: 2.0000 mg | ORAL_CAPSULE | ORAL | Status: DC | PRN

## 2019-04-25 MED ORDER — LAMOTRIGINE 100 MG PO TABS
100.0000 mg | ORAL_TABLET | Freq: Every day | ORAL | Status: DC
  Administered 2019-04-25 – 2019-04-28 (×4): 100 mg via ORAL
  Filled 2019-04-25 (×7): qty 1

## 2019-04-25 MED ORDER — DICYCLOMINE HCL 20 MG PO TABS: 20.0000 mg | ORAL_TABLET | Freq: Four times a day (QID) | ORAL | Status: DC | PRN

## 2019-04-25 MED ORDER — ONDANSETRON 4 MG PO TBDP: 4.0000 mg | ORAL_TABLET | Freq: Four times a day (QID) | ORAL | Status: DC | PRN

## 2019-04-25 MED ORDER — LURASIDONE HCL 20 MG PO TABS
20.0000 mg | ORAL_TABLET | Freq: Every day | ORAL | Status: DC
Start: 2019-04-26 — End: 2019-04-25
  Filled 2019-04-25: qty 1

## 2019-04-25 MED ORDER — HYDROXYZINE HCL 25 MG PO TABS
25.0000 mg | ORAL_TABLET | Freq: Three times a day (TID) | ORAL | Status: DC | PRN
  Filled 2019-04-25 (×2): qty 1

## 2019-04-25 NOTE — H&P (Signed)
Psychiatric Admission Assessment Adult  Patient Identification: Jamie Avery MRN:  [id number] Date of Evaluation:  04/25/2019 Chief Complaint:  MDD S1 Principal Diagnosis: <principal problem not specified> Diagnosis:  Active Problems:   MDD (major depressive disorder)  History of Present Illness: Patient is seen and examined.  Patient is a 33 year old female who presented as a voluntary walk-in patient on 04/25/2019 with suicidal ideation and depression.  The patient stated that she had been previously diagnosed with bipolar disorder, although she does not agree with that diagnosis.  [facility].  She was last seen there within the last 4 to 6 weeks.  She complained of depressive symptoms at that time, and her antidepressant medications were increased.  She stated that she has had increased stressors secondary to financial problems.  She was working at a job, and then became injured.  She is not been able to work since then.  Her short-term disability has not come through as well, and so the financial stressors just added onto things.  Her last psychiatric hospitalization was in 2015.  She was diagnosed with bipolar disorder at that time.  She was admitted secondary to self-injurious behaviors.  Her discharge medications at that time included Tegretol, Lexapro, olanzapine.  The patient reported that she became significantly more suicidal recently, and on 04/24/2019 considered overdosing on tablets.  She stated the only reason why it prevented her was the fact she was afraid she would fail at that.  She admitted to helplessness, hopelessness and worthlessness.  Decision was made to admit her to the hospital for evaluation and stabilization.  Of note, her history and physical from 04/26/2014 stated that she was a very unreliable historian.  She had initially denied abusing alcohol and drugs, but did later admit to having snorted 10 to 15 tablets of opiates later during that  hospitalization.  Associated Signs/Symptoms: Depression Symptoms:  depressed mood, anhedonia, insomnia, psychomotor agitation, fatigue, feelings of worthlessness/guilt, difficulty concentrating, hopelessness, suicidal thoughts with specific plan, anxiety, loss of energy/fatigue, disturbed sleep, (Hypo) Manic Symptoms:  Distractibility, Impulsivity, Irritable Mood, Anxiety Symptoms:  Excessive Worry, Psychotic Symptoms:  Denied PTSD Symptoms: Negative Total Time spent with patient: 30 minutes  Past Psychiatric History: Patient has been [facility] recently.  She was originally diagnosed with bipolar disorder in 2015.  She has had no other psychiatric hospitalizations since that first hospitalization [facility].  Prior to that she had 4 previous psychiatric admissions.  There is some suspect of underlying borderline personality disorder.  Is the patient at risk to self? Yes.    Has the patient been a risk to self in the past 6 months? No.  Has the patient been a risk to self within the distant past? Yes.    Is the patient a risk to others? No.  Has the patient been a risk to others in the past 6 months? No.  Has the patient been a risk to others within the distant past? No.   Prior Inpatient Therapy:   Prior Outpatient Therapy: Prior Outpatient Therapy: Yes Prior Therapy Dates: Ongoing Prior Therapy Facilty/Provider(s): [facility] Reason for Treatment: Depression; Bipolar I Disorder Does patient have an ACCT team?: No Does patient have Intensive In-House Services?  : No Does patient have [facility] services? : Yes Does patient have P4CC services?: No  Alcohol Screening:   Substance Abuse History in the last 12 months:  No. Consequences of Substance Abuse: Negative Previous Psychotropic Medications:  Yes  Psychological Evaluations: Yes  Past Medical History:  Past Medical History:  Diagnosis Date  . Anxiety   . Depression     Past  Surgical History:  Procedure Laterality Date  . ADENOIDECTOMY     Family History:  Family History  Family history unknown: Yes   Family Psychiatric  History: Noncontributory Tobacco Screening:   Social History:  Social History   Substance and Sexual Activity  Alcohol Use No     Social History   Substance and Sexual Activity  Drug Use Not Currently  . Types: Opium, Heroin    Additional Social History: Marital status: Single    Pain Medications: See MAR Prescriptions: See MAR Over the Counter: See MAR History of alcohol / drug use?: Yes Name of Substance 1: Opioids 1 - Last Use / Amount: 2015                  Allergies:  No Known Allergies Lab Results: No results found for this or any previous visit (from the past 48 hour(s)).  Blood Alcohol level:  No results found for: [facility]  Metabolic Disorder Labs:  No results found for: HGBA1C, MPG No results found for: PROLACTIN No results found for: CHOL, TRIG, HDL, CHOLHDL, VLDL, LDLCALC  Current Medications: Current Facility-Administered Medications  Medication Dose Route Frequency Provider Last Rate Last Dose  . acetaminophen (TYLENOL) tablet 650 mg  650 mg Oral Q6H PRN Derrill Center, NP      . alum & mag hydroxide-simeth (MAALOX/MYLANTA) 200-200-20 MG/5ML suspension 30 mL  30 mL Oral Q4H PRN Derrill Center, NP      . Derrill Memo ON 04/26/2019] FLUoxetine (PROZAC) capsule 60 mg  60 mg Oral Daily Sharma Covert, MD      . hydrOXYzine (ATARAX/VISTARIL) tablet 25 mg  25 mg Oral TID PRN Derrill Center, NP      . lamoTRIgine (LAMICTAL) tablet 100 mg  100 mg Oral Daily Derrill Center, NP      . Derrill Memo ON 04/26/2019] lurasidone (LATUDA) tablet 40 mg  40 mg Oral Q breakfast Sharma Covert, MD      . magnesium hydroxide (MILK OF MAGNESIA) suspension 30 mL  30 mL Oral Daily PRN Derrill Center, NP      . traZODone (DESYREL) tablet 50 mg  50 mg Oral QHS PRN Derrill Center, NP       PTA Medications: Medications Prior  to Admission  Medication Sig Dispense Refill Last Dose  . FLUoxetine (PROZAC) 20 MG capsule Take 1 capsule by mouth daily.     Marland Kitchen lamoTRIgine (LAMICTAL) 100 MG tablet Take 1 tablet by mouth 2 (two) times daily.     Marland Kitchen lurasidone (LATUDA) 20 MG TABS tablet Take 1 tablet by mouth daily.       Musculoskeletal: Strength & Muscle Tone: within normal limits Gait & Station: normal Patient leans: N/A  Psychiatric Specialty Exam: Physical Exam  Nursing note and vitals reviewed. Constitutional: She is oriented to person, place, and time. She appears well-developed and well-nourished.  HENT:  Head: Normocephalic and atraumatic.  Respiratory: Effort normal.  Neurological: She is alert and oriented to person, place, and time.    ROS  There were no vitals taken for this visit.There is no height or weight on file to calculate BMI.  General Appearance: Casual  Eye Contact:  Fair  Speech:  Normal Rate  Volume:  Normal  Mood:  Anxious and Depressed  Affect:  Congruent  Thought Process:  Coherent and Descriptions of Associations: Intact  Orientation:  Full (Time, Place, and Person)  Thought Content:  Logical  Suicidal Thoughts:  Yes.  with intent/plan  Homicidal Thoughts:  No  Memory:  Immediate;   Fair Recent;   Fair Remote;   Fair  Judgement:  Intact  Insight:  Fair  Psychomotor Activity:  Increased  Concentration:  Concentration: Fair and Attention Span: Fair  Recall:  [employer] of Knowledge:  Fair  Language:  Fair  Akathisia:  Negative  Handed:  Right  AIMS (if indicated):     Assets:  Desire for Improvement Resilience  ADL's:  Intact  Cognition:  WNL  Sleep:       Treatment Plan Summary: Daily contact with patient to assess and evaluate symptoms and progress in treatment, Medication management and Plan :Patient is seen and examined.  Patient is a 34 year old female with the above-stated past psychiatric history who is being admitted secondary to suicidal ideation.  She stated  that her fluoxetine dosage was increased to 60 mg recently, and reports no worsening episodes with regard to mania at that time.  She does not describe gross manic symptoms, but episodes where she "I just feel good and feel like I can do things".  To be cautious we will increase her Latuda to 40 mg p.o. daily today, and continue the fluoxetine at 60 mg and Lamictal at 100 mg.  As the patient is further evaluated and were more confident of the diagnosis then we will adjust other medicines if necessary.  She will be admitted to the unit.  She will be encouraged to attend groups.  She will be encouraged to work on her coping skills.  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  She denied any drug use.  She denied any medical problems or currently taking any other medicines outside of her psychiatric medicines.  Review of her entire hospitalization 2015 found that she had snorted opiates in the past.  She denied this to me.  I will put the opiate detox protocol basically in place just in case.  We will need the drug screen as soon as we can get that done.  Observation Level/Precautions:  15 minute checks  Laboratory:  Chemistry Profile  Psychotherapy:    Medications:    Consultations:    Discharge Concerns:    Estimated LOS:  Other:     Physician Treatment Plan for Primary Diagnosis: <principal problem not specified> Long Term Goal(s): Improvement in symptoms so as ready for discharge  Short Term Goals: Ability to identify changes in lifestyle to reduce recurrence of condition will improve, Ability to verbalize feelings will improve, Ability to disclose and discuss suicidal ideas, Ability to demonstrate self-control will improve, Ability to identify and develop effective coping behaviors will improve and Ability to maintain clinical measurements within normal limits will improve  Physician Treatment Plan for Secondary Diagnosis: Active Problems:   MDD (major  depressive disorder)  Long Term Goal(s): Improvement in symptoms so as ready for discharge  Short Term Goals: Ability to identify changes in lifestyle to reduce recurrence of condition will improve, Ability to verbalize feelings will improve, Ability to disclose and discuss suicidal ideas, Ability to demonstrate self-control will improve, Ability to identify and develop effective coping behaviors will improve and Ability to maintain clinical measurements within normal limits will improve  I certify that inpatient services furnished can reasonably be expected to improve the patient's  condition.    Antonieta Pert, MD 12/6/20202:13 PM

## 2019-04-25 NOTE — BH Assessment (Signed)
Assessment Note  Jamie Avery is a 33 y.o. female who presented to [facility] as a voluntary walk-in with complaint of recent suicidal ideation and other symptoms.  Pt lives in [place] with her roommate.  She is currently unemployed.  Pt receives outpatient psychiatry and therapy services from [facility] for treatment of depression and bipolar I disorder.    Pt reported as follows:  Pt has a history of suicidal ideation, despondency, and other depressive symptoms, for which she receives outpatient treatment.  Pt reported that in late October, she hurt her foot and had to leave work.  She is currently unemployed and anticipating short-term disability pay.  Disability is not yet received, and she is out of money.  Pt reported that on 04/24/2019, she considered suicide by overdosing on her pills.  ''The only reason I didn't is that I'm afraid of what would happen if I failed.''  Pt said she did not feel as suicidal today, but she was not sure if she would be safe if discharged.  In addition to suicidal ideation, Pt endorsed persistent and unremitting despondency, difficulty with sleep, isolation, fatigue, difficulty getting out of bed, feelings of worthlessness and hopelessness.  Pt denied hallucination, homicidal ideation, and current self-injurious behavior.  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  During assessment, Pt presented as alert and oriented.  She had good eye contact and was cooperative.  Pt was dressed in street clothes, and she appeared appropriately groomed.  Pt's mood was sad and helpless.  Affect was mood congruent.  Pt's speech was normal in rate, rhythm, and volume.  Thought processes were within normal range, and thought content was logical and goal-oriented.  Pt's memory and concentration were intact.  Pt's insight, judgment, and impulse control were fair.  Consulted with Myrle Sheng, NP, who also met with Pt.  Pt meets inpatient criteria.  Pt is to receive COVID test and be  admitted.  Diagnosis: Major Depression Disorder, Recurrent, Severe, w/o psychotic features; per history, Pt is also diagnosed with Bipolar I  Past Medical History:  Past Medical History:  Diagnosis Date  . Anxiety   . Depression     Past Surgical History:  Procedure Laterality Date  . ADENOIDECTOMY      Family History:  Family History  Family history unknown: Yes    Social History:  reports that she has been smoking cigarettes. She has been smoking about 0.50 packs per day. She has never used smokeless tobacco. She reports previous drug use. Drugs: Opium and Heroin. She reports that she does not drink alcohol.  Additional Social History:  Alcohol / Drug Use Pain Medications: See MAR Prescriptions: See MAR Over the Counter: See MAR History of alcohol / drug use?: Yes Substance #1 Name of Substance 1: Opioids 1 - Last Use / Amount: 2015  CIWA:   COWS:    Allergies: No Known Allergies  Home Medications: (Not in a hospital admission)   OB/GYN Status:  No LMP recorded. (Menstrual status: Irregular Periods).  General Assessment Data Location of Assessment: [facility] Assessment Services TTS Assessment: In system Is this a Tele or Face-to-Face Assessment?: Face-to-Face Is this an Initial Assessment or a Re-assessment for this encounter?: Initial Assessment Patient Accompanied by:: Other(Roommate) Language Other than English: No Living Arrangements: Other (Comment) What gender do you identify as?: Female Marital status: Single Pregnancy Status: No Living Arrangements: Non-relatives/Friends(relatives) Can pt return to current living arrangement?: Yes Admission Status: Voluntary Is patient capable of signing voluntary admission?: Yes  Referral Source: Self/Family/Friend Insurance type: None  Medical Screening Exam ([place]) Medical Exam completed: Yes  Crisis Care Plan Living Arrangements: Non-relatives/Friends(relatives) Name of Psychiatrist: [facility] Name of  Therapist: [facility]  Education Status Is patient currently in school?: No Is the patient employed, unemployed or receiving disability?: Unemployed  Risk to self with the past 6 months Suicidal Ideation: Yes-Currently Present Has patient been a risk to self within the past 6 months prior to admission? : No Suicidal Intent: No Has patient had any suicidal intent within the past 6 months prior to admission? : No Is patient at risk for suicide?: Yes Suicidal Plan?: Yes-Currently Present Has patient had any suicidal plan within the past 6 months prior to admission? : No Specify Current Suicidal Plan: Overdose Access to Means: Yes Specify Access to Suicidal Means: Prescribed meds What has been your use of drugs/alcohol within the last 12 months?: Denied Previous Attempts/Gestures: Yes How many times?: 3 Triggers for Past Attempts: Unpredictable Intentional Self Injurious Behavior: Cutting Comment - Self Injurious Behavior: Hx of cutting -- no behavior in five years Family Suicide History: Unknown Recent stressful life event(s): Financial Problems, Recent negative physical changes(Foot injury leading to unemployment) Persecutory voices/beliefs?: No Depression: Yes Depression Symptoms: Despondent, Tearfulness, Isolating, Insomnia, Loss of interest in usual pleasures, Feeling worthless/self pity, Fatigue Substance abuse history and/or treatment for substance abuse?: Yes Suicide prevention information given to non-admitted patients: Not applicable  Risk to Others within the past 6 months Homicidal Ideation: No Does patient have any lifetime risk of violence toward others beyond the six months prior to admission? : No Thoughts of Harm to Others: No Current Homicidal Intent: No Current Homicidal Plan: No Access to Homicidal Means: No History of harm to others?: No Assessment of Violence: None Noted Does patient have access to weapons?: No Criminal Charges Pending?: No Does patient have a  court date: No Is patient on probation?: No  Psychosis Hallucinations: None noted Delusions: None noted  Mental Status Report Appearance/Hygiene: Unremarkable, Other (Comment)(Street clothes) Eye Contact: Good Motor Activity: Freedom of movement, Unremarkable Speech: Logical/coherent Level of Consciousness: Alert Mood: Sad, Helpless Affect: Appropriate to circumstance Anxiety Level: Minimal Thought Processes: Relevant, Coherent Judgement: Partial Orientation: Person, Place, Time, Situation Obsessive Compulsive Thoughts/Behaviors: None  Cognitive Functioning Concentration: Good Memory: Recent Intact, Remote Intact Is patient IDD: No Insight: Fair Impulse Control: Fair Appetite: Fair Have you had any weight changes? : No Change Sleep: Decreased Total Hours of Sleep: (Mixed) Vegetative Symptoms: None  ADLScreening [facility] Assessment Services) Patient's cognitive ability adequate to safely complete daily activities?: Yes Patient able to express need for assistance with ADLs?: Yes Independently performs ADLs?: Yes (appropriate for developmental age)     Prior Outpatient Therapy Prior Outpatient Therapy: Yes Prior Therapy Dates: Ongoing Prior Therapy Facilty/Provider(s): [facility] Reason for Treatment: Depression; Bipolar I Disorder Does patient have an ACCT team?: No Does patient have Intensive In-House Services?  : No Does patient have [facility] services? : Yes Does patient have P4CC services?: No  ADL Screening (condition at time of admission) Patient's cognitive ability adequate to safely complete daily activities?: Yes Is the patient deaf or have difficulty hearing?: No Does the patient have difficulty seeing, even when wearing glasses/contacts?: No Does the patient have difficulty concentrating, remembering, or making decisions?: No Patient able to express need for assistance with ADLs?: Yes Does the patient have difficulty dressing or bathing?: No Independently  performs ADLs?: Yes (appropriate for developmental age) Does the patient have difficulty walking or climbing stairs?:  No Weakness of Legs: None Weakness of Arms/Hands: None  Home Assistive Devices/Equipment Home Assistive Devices/Equipment: None  Therapy Consults (therapy consults require a physician order) PT Evaluation Needed: No OT Evalulation Needed: No SLP Evaluation Needed: No Abuse/Neglect Assessment (Assessment to be complete while patient is alone) Abuse/Neglect Assessment Can Be Completed: Yes Physical Abuse: Denies Verbal Abuse: Denies Sexual Abuse: Denies Exploitation of patient/patient's resources: Denies Self-Neglect: Denies Values / Beliefs Cultural Requests During Hospitalization: None Spiritual Requests During Hospitalization: None Consults Spiritual Care Consult Needed: No Social Work Consult Needed: No Regulatory affairs officer (For Healthcare) Does Patient Have a Medical Advance Directive?: No          Disposition:  Disposition Initial Assessment Completed for this Encounter: Yes Disposition of Patient: Admit Type of inpatient treatment program: Adult(Per T. Bobby Rumpf, NP, Pt meets inpt criteria)  On Site Evaluation by:   Reviewed with Physician:    Laurena Slimmer Zenith Kercheval 04/25/2019 11:42 AM

## 2019-04-25 NOTE — [facility] Suicide Risk Assessment (Signed)
[facility] Admission Suicide Risk Assessment   Nursing information obtained from:    Demographic factors:    Current Mental Status:    Loss Factors:    Historical Factors:    Risk Reduction Factors:     Total Time spent with patient: 30 minutes Principal Problem: <principal problem not specified> Diagnosis:  Active Problems:   MDD (major depressive disorder)  Subjective Data: Patient is seen and examined.  Patient is a 33 year old female who presented as a voluntary walk-in patient on 04/25/2019 with suicidal ideation and depression.  The patient stated that she had been previously diagnosed with bipolar disorder, although she does not agree with that diagnosis.  She is followed at [facility] as an outpatient.  She was last seen there within the last 4 to 6 weeks.  She complained of depressive symptoms at that time, and her antidepressant medications were increased.  She stated that she has had increased stressors secondary to financial problems.  She was working at a job, and then became injured.  She is not been able to work since then.  Her short-term disability has not come through as well, and so the financial stressors just added onto things.  Her last psychiatric hospitalization was in 2015.  She was diagnosed with bipolar disorder at that time.  She was admitted secondary to self-injurious behaviors.  Her discharge medications at that time included Tegretol, Lexapro, olanzapine.  The patient reported that she became significantly more suicidal recently, and on 04/24/2019 considered overdosing on tablets.  She stated the only reason why it prevented her was the fact she was afraid she would fail at that.  She admitted to helplessness, hopelessness and worthlessness.  Decision was made to admit her to the hospital for evaluation and stabilization.  Continued Clinical Symptoms:    The "Alcohol Use Disorders Identification Test", Guidelines for Use in Primary Care, Second Edition.  World Environmental consultant [facility]). Score between 0-7:  no or low risk or alcohol related problems. Score between 8-15:  moderate risk of alcohol related problems. Score between 16-19:  high risk of alcohol related problems. Score 20 or above:  warrants further diagnostic evaluation for alcohol dependence and treatment.   CLINICAL FACTORS:   Bipolar Disorder:   Depressive phase Depression:   Anhedonia Hopelessness Impulsivity Insomnia   Musculoskeletal: Strength & Muscle Tone: within normal limits Gait & Station: normal Patient leans: N/A  Psychiatric Specialty Exam: Physical Exam  Nursing note and vitals reviewed. Constitutional: She is oriented to person, place, and time. She appears well-developed and well-nourished.  HENT:  Head: Normocephalic and atraumatic.  Respiratory: Effort normal.  Neurological: She is alert and oriented to person, place, and time.    ROS  There were no vitals taken for this visit.There is no height or weight on file to calculate BMI.  General Appearance: Casual  Eye Contact:  Good  Speech:  Normal Rate  Volume:  Normal  Mood:  Depressed  Affect:  Congruent  Thought Process:  Coherent and Descriptions of Associations: Intact  Orientation:  Full (Time, Place, and Person)  Thought Content:  Logical  Suicidal Thoughts:  Yes.  with intent/plan  Homicidal Thoughts:  No  Memory:  Immediate;   Fair Recent;   Fair Remote;   Fair  Judgement:  Intact  Insight:  Fair  Psychomotor Activity:  Normal  Concentration:  Concentration: Fair and Attention Span: Fair  Recall:  [employer] of Knowledge:  Fair  Language:  Good  Akathisia:  Negative  Handed:  Right  AIMS (if indicated):     Assets:  Desire for Improvement Resilience  ADL's:  Intact  Cognition:  WNL  Sleep:         COGNITIVE FEATURES THAT CONTRIBUTE TO RISK:  None    SUICIDE RISK:   Moderate:  Frequent suicidal ideation with limited intensity, and duration, some specificity in terms of plans, no  associated intent, good self-control, limited dysphoria/symptomatology, some risk factors present, and identifiable protective factors, including available and accessible social support.  PLAN OF CARE: Patient is seen and examined.  Patient is a 33 year old female with the above-stated past psychiatric history who is being admitted secondary to suicidal ideation.  She stated that her fluoxetine dosage was increased to 60 mg recently, and reports no worsening episodes with regard to mania at that time.  She does not describe gross manic symptoms, but episodes where she "I just feel good and feel like I can do things".  To be cautious we will increase her Latuda to 40 mg p.o. daily today, and continue the fluoxetine at 60 mg and Lamictal at 100 mg.  As the patient is further evaluated and were more confident of the diagnosis then we will adjust other medicines if necessary.  She will be admitted to the unit.  She will be encouraged to attend groups.  She will be encouraged to work on her coping skills.  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  She denied any drug use.  She denied any medical problems or currently taking any other medicines outside of her psychiatric medicines.  I certify that inpatient services furnished can reasonably be expected to improve the patient's condition.   Sharma Covert, MD 04/25/2019, 1:56 PM

## 2019-04-25 NOTE — H&P (Addendum)
Behavioral Health Medical Screening Exam  Jamie Avery is an 33 y.o. female.  Patient presents to [facility] behavioral health as a walk-in with her roommate.  Reporting worsening depression and suicidal ideation. Reported previous inpatient admissions.  Reports previous suicide attempts in the past.  Reports more recently worsening stressors related to employment and financial situation.  Reports taking medications as directed.  Support, encouragement and  reassurance was provided.  Total Time spent with patient: 15 minutes  Psychiatric Specialty Exam: Physical Exam  Vitals reviewed. Constitutional: She appears well-developed.  Neurological: She is alert.  Psychiatric: She has a normal mood and affect. Her behavior is normal.    Review of Systems  All other systems reviewed and are negative.   There were no vitals taken for this visit.There is no height or weight on file to calculate BMI.  General Appearance: Casual  Eye Contact:  Fair  Speech:  Clear and Coherent  Volume:  Normal  Mood:  Anxious and Depressed  Affect:  Congruent  Thought Process:  Coherent  Orientation:  Full (Time, Place, and Person)  Thought Content:  WDL  Suicidal Thoughts:  Yes.  with intent/plan  Homicidal Thoughts:  No  Memory:  Immediate;   Fair Recent;   Fair  Judgement:  Fair  Insight:  Fair  Psychomotor Activity:  Normal  Concentration: Concentration: Fair  Recall:  [employer] of Knowledge:Fair  Language: Poor  Akathisia:  No  Handed:  Right  AIMS (if indicated):     Assets:  Communication Skills Desire for Improvement Resilience Social Support  Sleep:       Musculoskeletal: Strength & Muscle Tone: within normal limits Gait & Station: normal Patient leans: N/A  There were no vitals taken for this visit.  Recommendations: Inpatient admission  Based on my evaluation the patient does not appear to have an emergency medical condition.  Derrill Center, NP 04/25/2019, 11:59 AM

## 2019-04-25 NOTE — Progress Notes (Signed)
Pt admitted to the adult unit by Tamela Oddi, RN. Writer introduced herself to the pt. Writer explained the unit expectations and schedule to the pt. No concerns or questions verbalized by the pt. Pt denies active SI and verbally contracts for safety. Pt provided with a urine specimen cup for an UA order. Education provided.

## 2019-04-25 NOTE — Progress Notes (Signed)
Psychoeducational Group Note  Date:  04/25/2019 Time:  2030  Group Topic/Focus:  wrap up group  Participation Level: Did Not Attend  Participation Quality:  Not Applicable  Affect:  Not Applicable  Cognitive:  Not Applicable  Insight:  Not Applicable  Engagement in Group: Not Applicable  Additional Comments:  Pt was newly admitted to the unit and was asleep during group time.   Winfield Rast S 04/25/2019, 10:10 PM

## 2019-04-26 LAB — RAPID URINE DRUG SCREEN, HOSP PERFORMED
Amphetamines: NOT DETECTED
Barbiturates: NOT DETECTED
Benzodiazepines: NOT DETECTED
Cocaine: NOT DETECTED
Opiates: NOT DETECTED
Tetrahydrocannabinol: NOT DETECTED

## 2019-04-26 LAB — CBC
HCT: 44.5 % (ref 36.0–46.0)
Hemoglobin: 14.8 g/dL (ref 12.0–15.0)
MCH: 30.2 pg (ref 26.0–34.0)
MCHC: 33.3 g/dL (ref 30.0–36.0)
MCV: 90.8 fL (ref 80.0–100.0)
Platelets: 353 10*3/uL (ref 150–400)
RBC: 4.9 MIL/uL (ref 3.87–5.11)
RDW: 12.2 % (ref 11.5–15.5)
WBC: 8.9 10*3/uL (ref 4.0–10.5)
nRBC: 0 % (ref 0.0–0.2)

## 2019-04-26 LAB — COMPREHENSIVE METABOLIC PANEL
ALT: 39 U/L (ref 0–44)
AST: 23 U/L (ref 15–41)
Albumin: 4.5 g/dL (ref 3.5–5.0)
Alkaline Phosphatase: 67 U/L (ref 38–126)
Anion gap: 13 (ref 5–15)
BUN: 14 mg/dL (ref 6–20)
CO2: 26 mmol/L (ref 22–32)
Calcium: 9.9 mg/dL (ref 8.9–10.3)
Chloride: 99 mmol/L (ref 98–111)
Creatinine, Ser: 0.72 mg/dL (ref 0.44–1.00)
GFR calc Af Amer: >60 mL/min (ref >60)
GFR calc non Af Amer: >60 mL/min (ref >60)
Glucose, Bld: 103 mg/dL — ABNORMAL HIGH (ref 70–99)
Potassium: 4.6 mmol/L (ref 3.5–5.1)
Sodium: 138 mmol/L (ref 135–145)
Total Bilirubin: 0.6 mg/dL (ref 0.3–1.2)
Total Protein: 7.6 g/dL (ref 6.5–8.1)

## 2019-04-26 LAB — LIPID PANEL
Cholesterol: 188 mg/dL (ref 0–200)
HDL: 65 mg/dL (ref >40)
LDL Cholesterol: 111 mg/dL — ABNORMAL HIGH (ref 0–99)
Total CHOL/HDL Ratio: 2.9 RATIO
Triglycerides: 62 mg/dL (ref <150)
VLDL: 12 mg/dL (ref 0–40)

## 2019-04-26 LAB — HEMOGLOBIN A1C
Hgb A1c MFr Bld: 5.3 % (ref 4.8–5.6)
Mean Plasma Glucose: 105.41 mg/dL

## 2019-04-26 LAB — LITHIUM LEVEL: Lithium Lvl: <0.06 mmol/L — ABNORMAL LOW (ref 0.60–1.20)

## 2019-04-26 LAB — TSH: TSH: 1.836 u[IU]/mL (ref 0.350–4.500)

## 2019-04-26 LAB — PREGNANCY, URINE: Preg Test, Ur: NEGATIVE

## 2019-04-26 NOTE — [facility] Group Notes (Signed)
LCSW Group Therapy Note 04/26/2019 1:57 PM  Type of Therapy and Topic: Group Therapy: Overcoming Obstacles  Participation Level: Did Not Attend  Description of Group:  In this group patients will be encouraged to explore what they see as obstacles to their own wellness and recovery. They will be guided to discuss their thoughts, feelings, and behaviors related to these obstacles. The group will process together ways to cope with barriers, with attention given to specific choices patients can make. Each patient will be challenged to identify changes they are motivated to make in order to overcome their obstacles. This group will be process-oriented, with patients participating in exploration of their own experiences as well as giving and receiving support and challenge from other group members.  Therapeutic Goals: 1. Patient will identify personal and current obstacles as they relate to admission. 2. Patient will identify barriers that currently interfere with their wellness or overcoming obstacles.  3. Patient will identify feelings, thought process and behaviors related to these barriers. 4. Patient will identify two changes they are willing to make to overcome these obstacles:   Summary of Patient Progress  Invited, chose not to attend. Patient was asleep in their room.    Therapeutic Modalities:  Cognitive Behavioral Therapy Solution Focused Therapy Motivational Interviewing Relapse Prevention Therapy   Britley Gashi LCSWA Clinical Social Worker   

## 2019-04-26 NOTE — Progress Notes (Signed)
[facility] MD Progress Note  04/26/2019 1:18 PM Jamie Avery  MRN:  [id number] Subjective: Patient reports she is feeling "better".  Today denies medication side effects.  No suicidal ideations. Objective: I have reviewed case with treatment team and have met with patient. 33 year old female, presented to hospital voluntarily yesterday, reporting depression, suicidal ideations.  Contributing stressors include work-related injury and current unemployment/financial difficulties. In the past has been diagnosed with bipolar disorder.  Patient states she is not sure whether this is an appropriate diagnosis.  Describes brief episodes of subjective mood swings/feeling more energetic, a short duration, but does not endorse any clear history of mania or overt hypomanic episodes.  She does endorse a history of depressive episodes in the past.  Reports past history of opiate use disorder, now in sustained remission (years).  Today patient presents alert, attentive, calm, pleasant on approach.  States she is feeling better than on admission. Currently denies medication side effects.  She is currently on Latuda (recently titrated to 40 mg daily), lamotrigine (  100 mg daily), Prozac (recently increased to 60 mg daily).  Has tolerated medication titration well thus far. We have reviewed side effect profile including potential for lamotrigine to cause severe rash/Stevens-Johnson syndrome. Visible on unit, behavior in good control, polite on approach. Labs reviewed-BMP unremarkable, lipid panel unremarkable (LDL 111), CBC unremarkable.  TSH 1.83, hemoglobin A1c 5.3.  Of note a lithium level was done- less than 0.06 (patient states she has not taken lithium in more than a year)   Principal Problem: MDD (major depressive disorder) Diagnosis: Principal Problem:   MDD (major depressive disorder)  Total Time spent with patient: 20 minutes  Past Psychiatric History:   Past Medical History:  Past Medical History:   Diagnosis Date  . Anxiety   . Depression     Past Surgical History:  Procedure Laterality Date  . ADENOIDECTOMY     Family History:  Family History  Family history unknown: Yes   Family Psychiatric  History:  Social History:  Social History   Substance and Sexual Activity  Alcohol Use No     Social History   Substance and Sexual Activity  Drug Use Not Currently  . Types: Opium, Heroin    Social History   Socioeconomic History  . Marital status: Single    Spouse name: Not on file  . Number of children: Not on file  . Years of education: Not on file  . Highest education level: Not on file  Occupational History  . Occupation: Unemployed  Social Needs  . Financial resource strain: Not on file  . Food insecurity    Worry: Not on file    Inability: Not on file  . Transportation needs    Medical: Not on file    Non-medical: Not on file  Tobacco Use  . Smoking status: Current Every Day Smoker    Packs/day: 0.50    Types: Cigarettes  . Smokeless tobacco: Never Used  Substance and Sexual Activity  . Alcohol use: No  . Drug use: Not Currently    Types: Opium, Heroin  . Sexual activity: Not Currently  Lifestyle  . Physical activity    Days per week: Not on file    Minutes per session: Not on file  . Stress: Not on file  Relationships  . Social Herbalist on phone: Not on file    Gets together: Not on file    Attends religious service: Not on file  Active member of club or organization: Not on file    Attends meetings of clubs or organizations: Not on file    Relationship status: Not on file  Other Topics Concern  . Not on file  Social History Narrative   Pt lives in [place] with roommate.  Currently unemployed due to foot injury.  Client is followed by [facility] for treatment of depression and Bipolar I disorder.   Additional Social History:    Pain Medications: See MAR Prescriptions: See MAR Over the Counter: See MAR History of alcohol /  drug use?: Yes Name of Substance 1: Opioids 1 - Last Use / Amount: 2015  Sleep: Good  Appetite:  Good  Current Medications: Current Facility-Administered Medications  Medication Dose Route Frequency Provider Last Rate Last Dose  . acetaminophen (TYLENOL) tablet 650 mg  650 mg Oral Q6H PRN Derrill Center, NP      . alum & mag hydroxide-simeth (MAALOX/MYLANTA) 200-200-20 MG/5ML suspension 30 mL  30 mL Oral Q4H PRN Derrill Center, NP      . dicyclomine (BENTYL) tablet 20 mg  20 mg Oral Q6H PRN Sharma Covert, MD      . FLUoxetine (PROZAC) capsule 60 mg  60 mg Oral Daily Sharma Covert, MD   60 mg at 04/26/19 0805  . hydrOXYzine (ATARAX/VISTARIL) tablet 25 mg  25 mg Oral TID PRN Derrill Center, NP      . lamoTRIgine (LAMICTAL) tablet 100 mg  100 mg Oral Daily Derrill Center, NP   100 mg at 04/26/19 0805  . loperamide (IMODIUM) capsule 2-4 mg  2-4 mg Oral PRN Sharma Covert, MD      . lurasidone (LATUDA) tablet 40 mg  40 mg Oral Q breakfast Sharma Covert, MD   40 mg at 04/26/19 0805  . magnesium hydroxide (MILK OF MAGNESIA) suspension 30 mL  30 mL Oral Daily PRN Derrill Center, NP      . methocarbamol (ROBAXIN) tablet 500 mg  500 mg Oral Q8H PRN Sharma Covert, MD      . naproxen (NAPROSYN) tablet 500 mg  500 mg Oral BID PRN Sharma Covert, MD      . ondansetron (ZOFRAN-ODT) disintegrating tablet 4 mg  4 mg Oral Q6H PRN Sharma Covert, MD      . traZODone (DESYREL) tablet 50 mg  50 mg Oral QHS PRN Derrill Center, NP        Lab Results:  Results for orders placed or performed during the hospital encounter of 04/25/19 (from the past 48 hour(s))  SARS Coronavirus 2 by RT PCR (hospital order, performed in [place] hospital lab)     Status: None   Collection Time: 04/25/19 12:10 PM  Result Value Ref Range   SARS Coronavirus 2 NEGATIVE NEGATIVE    Comment: (NOTE) SARS-CoV-2 target nucleic acids are NOT DETECTED. The SARS-CoV-2 RNA is generally detectable  in upper and lower respiratory specimens during the acute phase of infection. The lowest concentration of SARS-CoV-2 viral copies this assay can detect is 250 copies / mL. A negative result does not preclude SARS-CoV-2 infection and should not be used as the sole basis for treatment or other patient management decisions.  A negative result may occur with improper specimen collection / handling, submission of specimen other than nasopharyngeal swab, presence of viral mutation(s) within the areas targeted by this assay, and inadequate number of viral copies (<250 copies / mL). A negative result must  be combined with clinical observations, patient history, and epidemiological information. Fact Sheet for Patients:   StrictlyIdeas.no Fact Sheet for Healthcare Providers: BankingDealers.co.za This test is not yet approved or cleared  by the [place] FDA and has been authorized for detection and/or diagnosis of SARS-CoV-2 by FDA under an Emergency Use Authorization (EUA).  This EUA will remain in effect (meaning this test can be used) for the duration of the COVID-19 declaration under Section 564(b)(1) of the Act, 21 U.S.C. section 360bbb-3(b)(1), unless the authorization is terminated or revoked sooner. Performed at [facility], [place] [address], [place], [place] [address]   Pregnancy, urine     Status: None   Collection Time: 04/26/19  4:25 AM  Result Value Ref Range   Preg Test, Ur NEGATIVE NEGATIVE    Comment:        THE SENSITIVITY OF THIS METHODOLOGY IS >20 mIU/mL. Performed at [facility], [place] [address] [place], [place] [address]   Rapid urine drug screen (hospital performed)     Status: None   Collection Time: 04/26/19  4:25 AM  Result Value Ref Range   Opiates NONE DETECTED NONE DETECTED   Cocaine NONE DETECTED NONE DETECTED   Benzodiazepines NONE DETECTED NONE DETECTED   Amphetamines  NONE DETECTED NONE DETECTED   Tetrahydrocannabinol NONE DETECTED NONE DETECTED   Barbiturates NONE DETECTED NONE DETECTED    Comment: (NOTE) DRUG SCREEN FOR MEDICAL PURPOSES ONLY.  IF CONFIRMATION IS NEEDED FOR ANY PURPOSE, NOTIFY LAB WITHIN 5 DAYS. LOWEST DETECTABLE LIMITS FOR URINE DRUG SCREEN Drug Class                     Cutoff (ng/mL) Amphetamine and metabolites    1000 Barbiturate and metabolites    200 Benzodiazepine                 355 Tricyclics and metabolites     300 Opiates and metabolites        300 Cocaine and metabolites        300 THC                            50 Performed at [facility], [place] [address] [place], [place] [address]   TSH     Status: None   Collection Time: 04/26/19  6:52 AM  Result Value Ref Range   TSH 1.836 0.350 - 4.500 uIU/mL    Comment: Performed by a 3rd Generation assay with a functional sensitivity of <=0.01 uIU/mL. Performed at [facility], [place] [address], [place], [place] [address]   CBC     Status: None   Collection Time: 04/26/19  6:52 AM  Result Value Ref Range   WBC 8.9 4.0 - 10.5 K/uL   RBC 4.90 3.87 - 5.11 MIL/uL   Hemoglobin 14.8 12.0 - 15.0 g/dL   HCT 44.5 36.0 - 46.0 %   MCV 90.8 80.0 - 100.0 fL   MCH 30.2 26.0 - 34.0 pg   MCHC 33.3 30.0 - 36.0 g/dL   RDW 12.2 11.5 - 15.5 %   Platelets 353 150 - 400 K/uL   nRBC 0.0 0.0 - 0.2 %    Comment: Performed at [facility], [place] [address] [place], [place] [address]  Comprehensive metabolic panel     Status: Abnormal   Collection Time: 04/26/19  6:52 AM  Result Value Ref Range  Sodium 138 135 - 145 mmol/L   Potassium 4.6 3.5 - 5.1 mmol/L   Chloride 99 98 - 111 mmol/L   CO2 26 22 - 32 mmol/L   Glucose, Bld 103 (H) 70 - 99 mg/dL   BUN 14 6 - 20 mg/dL   Creatinine, Ser 0.72 0.44 - 1.00 mg/dL   Calcium 9.9 8.9 - 10.3 mg/dL   Total Protein 7.6 6.5 - 8.1 g/dL   Albumin 4.5 3.5 - 5.0 g/dL   AST 23 15 - 41 U/L   ALT  39 0 - 44 U/L   Alkaline Phosphatase 67 38 - 126 U/L   Total Bilirubin 0.6 0.3 - 1.2 mg/dL   GFR calc non Af Amer >60 >60 mL/min   GFR calc Af Amer >60 >60 mL/min   Anion gap 13 5 - 15    Comment: Performed at [facility], [place] [address] [place], [place] [address]  Hemoglobin A1c     Status: None   Collection Time: 04/26/19  6:52 AM  Result Value Ref Range   Hgb A1c MFr Bld 5.3 4.8 - 5.6 %    Comment: (NOTE) Pre diabetes:          5.7%-6.4% Diabetes:              >6.4% Glycemic control for   <7.0% adults with diabetes    Mean Plasma Glucose 105.41 mg/dL    Comment: Performed at [place] [address], [place], [place] [address]  Lipid panel     Status: Abnormal   Collection Time: 04/26/19  6:52 AM  Result Value Ref Range   Cholesterol 188 0 - 200 mg/dL   Triglycerides 62 <150 mg/dL   HDL 65 >40 mg/dL   Total CHOL/HDL Ratio 2.9 RATIO   VLDL 12 0 - 40 mg/dL   LDL Cholesterol 111 (H) 0 - 99 mg/dL    Comment:        Total Cholesterol/HDL:CHD Risk Coronary Heart Disease Risk Table                     Men   Women  1/2 Average Risk   3.4   3.3  Average Risk       5.0   4.4  2 X Average Risk   9.6   7.1  3 X Average Risk  23.4   11.0        Use the calculated Patient Ratio above and the CHD Risk Table to determine the patient's CHD Risk.        ATP III CLASSIFICATION (LDL):  <100     mg/dL   Optimal  100-129  mg/dL   Near or Above                    Optimal  130-159  mg/dL   Borderline  160-189  mg/dL   High  >190     mg/dL   Very High Performed at [place] [address], [place], [place] [address]   Lithium level     Status: Abnormal   Collection Time: 04/26/19  6:52 AM  Result Value Ref Range   Lithium Lvl <0.06 (L) 0.60 - 1.20 mmol/L    Comment: Performed at [facility], [place] [address] [place], [place] [address]    Blood Alcohol level:  No results found for: [facility]  Metabolic Disorder  Labs: Lab Results  Component Value Date  HGBA1C 5.3 04/26/2019   MPG 105.41 04/26/2019   No results found for: PROLACTIN Lab Results  Component Value Date   CHOL 188 04/26/2019   TRIG 62 04/26/2019   HDL 65 04/26/2019   CHOLHDL 2.9 04/26/2019   VLDL 12 04/26/2019   LDLCALC 111 (H) 04/26/2019    Physical Findings: AIMS:  , ,  ,  ,    CIWA:    COWS:  COWS Total Score: 0  Musculoskeletal: Strength & Muscle Tone: within normal limits Gait & Station: normal Patient leans: N/A  Psychiatric Specialty Exam: Physical Exam  ROS  no chest pain, no cough, no vomiting, no rash, no fever, no chills  Blood pressure 128/84, pulse 89, temperature 98 F (36.7 C), temperature source Oral, resp. rate 16, height 5' 6"  (1.676 m), weight 98.9 kg, SpO2 98 %.Body mass index is 35.19 kg/m.  General Appearance: Well Groomed  Eye Contact:  Good  Speech:  Normal Rate-not pressured  Volume:  Normal  Mood:  Reports feeling better today than she did prior to admission  Affect:  Mildly constricted but improves during session, smiles at times appropriately  Thought Process:  Linear and Descriptions of Associations: Intact  Orientation:  Other:  Fully alert and attentive  Thought Content:  No hallucinations, no delusions/not internally preoccupied  Suicidal Thoughts:  No denies suicidal or self-injurious ideations, denies homicidal or violent ideations  Homicidal Thoughts:  No  Memory:  Recent and remote grossly intact  Judgement:  Other:  Improving  Insight:  Fair/improving  Psychomotor Activity:  Normal no psychomotor agitation or restlessness  Concentration:  Concentration: Good and Attention Span: Good  Recall:  Good  Fund of Knowledge:  Good  Language:  Good  Akathisia:  Negative  Handed:  Right  AIMS (if indicated):     Assets:  Desire for Improvement Resilience  ADL's:  Intact  Cognition:  WNL  Sleep:  Number of Hours: 5.75   Assessment:  33 year old female, presented to hospital  voluntarily yesterday, reporting depression, suicidal ideations.  Contributing stressors include work-related injury and current unemployment/financial difficulties. In the past has been diagnosed with bipolar disorder.  Patient states she is not sure whether this is an appropriate diagnosis.  Describes brief episodes of subjective mood swings/feeling more energetic, a short duration, but does not endorse any clear history of mania or overt hypomanic episodes.  She does endorse a history of depressive episodes in the past.  Reports past history of opiate use disorder, now in sustained remission (years).  Today patient reports some improvement compared to admission.  Her affect presents reactive.  Denies SI at this time.  Reports prior diagnosis of bipolar disorder in the past but does not endorse any clear history of hypomanic or manic episodes.  Describes episodes of depression and brief mood swings. Was taking Prozac, lamotrigine, Latuda prior to admission.  Prozac and Latuda doses have been titrated.  Tolerating well thus far, denies side effects.  Treatment Plan Summary: Daily contact with patient to assess and evaluate symptoms and progress in treatment, Medication management, Plan Inpatient treatment and Medications as below Encourage group and milieu participation Continue Prozac 60 mg daily for depression/anxiety Continue lamotrigine 100 mg daily for mood disorder Continue Latuda 40 mg daily for mood disorder Continue trazodone 50 mg nightly as needed for insomnia Treatment team working on disposition planning options Jenne Campus, MD 04/26/2019, 1:18 PM

## 2019-04-26 NOTE — Tx Team (Signed)
Interdisciplinary Treatment and Diagnostic Plan Update  04/26/2019 Time of Session: 10:00am Jamie Avery MRN: [id number]  Principal Diagnosis: MDD (major depressive disorder)  Secondary Diagnoses: Principal Problem:   MDD (major depressive disorder)   Current Medications:  Current Facility-Administered Medications  Medication Dose Route Frequency Provider Last Rate Last Dose  . acetaminophen (TYLENOL) tablet 650 mg  650 mg Oral Q6H PRN Derrill Center, NP      . alum & mag hydroxide-simeth (MAALOX/MYLANTA) 200-200-20 MG/5ML suspension 30 mL  30 mL Oral Q4H PRN Derrill Center, NP      . dicyclomine (BENTYL) tablet 20 mg  20 mg Oral Q6H PRN Sharma Covert, MD      . FLUoxetine (PROZAC) capsule 60 mg  60 mg Oral Daily Sharma Covert, MD   60 mg at 04/26/19 0805  . hydrOXYzine (ATARAX/VISTARIL) tablet 25 mg  25 mg Oral TID PRN Derrill Center, NP      . lamoTRIgine (LAMICTAL) tablet 100 mg  100 mg Oral Daily Derrill Center, NP   100 mg at 04/26/19 0805  . loperamide (IMODIUM) capsule 2-4 mg  2-4 mg Oral PRN Sharma Covert, MD      . lurasidone (LATUDA) tablet 40 mg  40 mg Oral Q breakfast Sharma Covert, MD   40 mg at 04/26/19 0805  . magnesium hydroxide (MILK OF MAGNESIA) suspension 30 mL  30 mL Oral Daily PRN Derrill Center, NP      . methocarbamol (ROBAXIN) tablet 500 mg  500 mg Oral Q8H PRN Sharma Covert, MD      . naproxen (NAPROSYN) tablet 500 mg  500 mg Oral BID PRN Sharma Covert, MD      . ondansetron (ZOFRAN-ODT) disintegrating tablet 4 mg  4 mg Oral Q6H PRN Sharma Covert, MD      . traZODone (DESYREL) tablet 50 mg  50 mg Oral QHS PRN Derrill Center, NP       PTA Medications: Medications Prior to Admission  Medication Sig Dispense Refill Last Dose  . FLUoxetine (PROZAC) 20 MG capsule Take 1 capsule by mouth daily.     Marland Kitchen lamoTRIgine (LAMICTAL) 100 MG tablet Take 1 tablet by mouth 2 (two) times daily.     Marland Kitchen lurasidone (LATUDA) 20 MG TABS tablet  Take 1 tablet by mouth daily.       Patient Stressors:    Patient Strengths:    Treatment Modalities: Medication Management, Group therapy, Case management,  1 to 1 session with clinician, Psychoeducation, Recreational therapy.   Physician Treatment Plan for Primary Diagnosis: MDD (major depressive disorder) Long Term Goal(s): Improvement in symptoms so as ready for discharge Improvement in symptoms so as ready for discharge   Short Term Goals: Ability to identify changes in lifestyle to reduce recurrence of condition will improve Ability to verbalize feelings will improve Ability to disclose and discuss suicidal ideas Ability to demonstrate self-control will improve Ability to identify and develop effective coping behaviors will improve Ability to maintain clinical measurements within normal limits will improve Ability to identify changes in lifestyle to reduce recurrence of condition will improve Ability to verbalize feelings will improve Ability to disclose and discuss suicidal ideas Ability to demonstrate self-control will improve Ability to identify and develop effective coping behaviors will improve Ability to maintain clinical measurements within normal limits will improve  Medication Management: Evaluate patient's response, side effects, and tolerance of medication regimen.  Therapeutic Interventions: 1 to 1 sessions, Unit  Group sessions and Medication administration.  Evaluation of Outcomes: Not Met  Physician Treatment Plan for Secondary Diagnosis: Principal Problem:   MDD (major depressive disorder)  Long Term Goal(s): Improvement in symptoms so as ready for discharge Improvement in symptoms so as ready for discharge   Short Term Goals: Ability to identify changes in lifestyle to reduce recurrence of condition will improve Ability to verbalize feelings will improve Ability to disclose and discuss suicidal ideas Ability to demonstrate self-control will  improve Ability to identify and develop effective coping behaviors will improve Ability to maintain clinical measurements within normal limits will improve Ability to identify changes in lifestyle to reduce recurrence of condition will improve Ability to verbalize feelings will improve Ability to disclose and discuss suicidal ideas Ability to demonstrate self-control will improve Ability to identify and develop effective coping behaviors will improve Ability to maintain clinical measurements within normal limits will improve     Medication Management: Evaluate patient's response, side effects, and tolerance of medication regimen.  Therapeutic Interventions: 1 to 1 sessions, Unit Group sessions and Medication administration.  Evaluation of Outcomes: Not Met   RN Treatment Plan for Primary Diagnosis: MDD (major depressive disorder) Long Term Goal(s): Knowledge of disease and therapeutic regimen to maintain health will improve  Short Term Goals: Ability to participate in decision making will improve, Ability to verbalize feelings will improve, Ability to disclose and discuss suicidal ideas and Ability to identify and develop effective coping behaviors will improve  Medication Management: RN will administer medications as ordered by provider, will assess and evaluate patient's response and provide education to patient for prescribed medication. RN will report any adverse and/or side effects to prescribing provider.  Therapeutic Interventions: 1 on 1 counseling sessions, Psychoeducation, Medication administration, Evaluate responses to treatment, Monitor vital signs and CBGs as ordered, Perform/monitor CIWA, COWS, AIMS and Fall Risk screenings as ordered, Perform wound care treatments as ordered.  Evaluation of Outcomes: Not Met   LCSW Treatment Plan for Primary Diagnosis: MDD (major depressive disorder) Long Term Goal(s): Safe transition to appropriate next level of care at discharge,  Engage patient in therapeutic group addressing interpersonal concerns.  Short Term Goals: Engage patient in aftercare planning with referrals and resources  Therapeutic Interventions: Assess for all discharge needs, 1 to 1 time with Social worker, Explore available resources and support systems, Assess for adequacy in community support network, Educate family and significant other(s) on suicide prevention, Complete Psychosocial Assessment, Interpersonal group therapy.  Evaluation of Outcomes: Not Met   Progress in Treatment: Attending groups: No. Participating in groups: No. Taking medication as prescribed: Yes. Toleration medication: Yes. Family/Significant other contact made: No, will contact:  if patient consents to collateral contacts  Patient understands diagnosis: Yes. Discussing patient identified problems/goals with staff: Yes. Medical problems stabilized or resolved: Yes. Denies suicidal/homicidal ideation: Yes. Issues/concerns per patient self-inventory: No. Other:   New problem(s) identified: None   New Short Term/Long Term Goal(s): medication stabilization, elimination of SI thoughts, development of comprehensive mental wellness plan.    Patient Goals: "Getting my thoughts under control"    Discharge Plan or Barriers: Patient recently admitted. CSW will continue to follow and assess for appropriate referrals and possible discharge planning.    Reason for Continuation of Hospitalization: Anxiety Depression Medication stabilization Suicidal ideation  Estimated Length of Stay: 3-5 days   Attendees: Patient: Jamie Avery  04/26/2019 10:46 AM  Physician: Dr. Neita Garnet, MD 04/26/2019 10:46 AM  Nursing: Elberta Fortis.A, RN 04/26/2019 10:46 AM  RN Care Manager:  04/26/2019 10:46 AM  Social Worker: Radonna Ricker, LCSW 04/26/2019 10:46 AM  Recreational Therapist:  04/26/2019 10:46 AM  Other: Harriett Sine, NP 04/26/2019 10:46 AM  Other:  04/26/2019 10:46 AM  Other: 04/26/2019  10:46 AM    Scribe for Treatment Team: Marylee Floras, [place] 04/26/2019 10:46 AM

## 2019-04-26 NOTE — Progress Notes (Signed)
Spiritual care group on grief and loss facilitated by chaplain Jerene Pitch MDiv, B[facility]  Group Goal:  Support / Education around grief and loss Members engage in facilitated group support and psycho-social education.  Group Description:  Following introductions and group rules, group members engaged in facilitated group dialog and support around topic of loss, with particular support around experiences of loss in their lives. Group Identified types of loss (relationships / self / things) and identified patterns, circumstances, and changes that precipitate losses. Reflected on thoughts / feelings around loss, normalized grief responses, and recognized variety in grief experience.   Group noted Worden's four tasks of grief in discussion.  Group drew on Adlerian / Rogerian, narrative, MI, Patient Progress:  Jamie Avery was present throughout group.  Did not engage in group discussion.

## 2019-04-26 NOTE — [facility] Suicide Risk Assessment (Signed)
[facility] INPATIENT:  Family/Significant Other Suicide Prevention Education  Suicide Prevention Education:  Patient Refusal for Family/Significant Other Suicide Prevention Education: The patient Jamie Avery has refused to provide written consent for family/significant other to be provided Family/Significant Other Suicide Prevention Education during admission and/or prior to discharge.  Physician notified.  [name] T Syrah Daughtrey 04/26/2019, 10:38 AM

## 2019-04-26 NOTE — Progress Notes (Signed)
   04/26/19 1100  Psych Admission Type (Psych Patients Only)  Admission Status Involuntary  Psychosocial Assessment  Patient Complaints Depression  Eye Contact Fair  Facial Expression Flat  Affect Depressed  Speech Logical/coherent  Interaction Minimal  Motor Activity Slow  Appearance/Hygiene Unremarkable  Behavior Characteristics Appropriate to situation;Cooperative  Mood Depressed  Thought Process  Coherency WDL  Content WDL  Delusions None reported or observed  Perception WDL  Hallucination None reported or observed  Judgment WDL  Confusion None  Danger to Self  Current suicidal ideation? Denies  Danger to Others  Danger to Others None reported or observed   Patient sleeping on approach. Called pt multiple times without response. Respiration regular and unlabored. Will continue to monitor pt throughout the night.

## 2019-04-26 NOTE — Progress Notes (Signed)
Recreation Therapy Notes  Date:  12.7.20 Time: 0930 Location: 300 Hall Dayroom  Group Topic: Stress Management  Goal Area(s) Addresses:  Patient will identify positive stress management techniques. Patient will identify benefits of using stress management post d/c.  Behavioral Response:  Engaged  Intervention: Stress Management  Activity :  Guided Imagery.  LRT read a script that guided patients on a relaxing journey to [place].  Patients were to listen and follow along as LRT read script to engage in activity.    Education:  Stress Management, Discharge Planning.   Education Outcome: Acknowledges Education  Clinical Observations/Feedback: Pt attended and participated in activity.     [name], LRT/CTRS         [name] A 04/26/2019 12:06 PM

## 2019-04-26 NOTE — [facility] Suicide Risk Assessment (Signed)
SPE completed with patient, as patient refused to consent to family contact. SPI pamphlet provided to pt and pt was encouraged to share information with support network, ask questions, and talk about any concerns relating to SPE. Patient denies access to guns/firearms and verbalized understanding of information provided. Mobile Crisis information also provided to patient.  Kentavius Dettore, MSW, LCSW Clinical Social Worker  [facility]  Phone: [phone]  
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
## 2019-04-26 NOTE — Plan of Care (Signed)
  Problem: Activity: Goal: Interest or engagement in leisure activities will improve Outcome: Progressing   Problem: Coping: Goal: Coping ability will improve Outcome: Progressing   Problem: Health Behavior/Discharge Planning: Goal: Compliance with therapeutic regimen will improve Outcome: Progressing

## 2019-04-26 NOTE — Progress Notes (Signed)
Patient ID: Jamie Avery, female   DOB: Apr 17, 1986, 33 y.o.   MRN: [id number]   [place] NOVEL CORONAVIRUS (COVID-19) DAILY CHECK-OFF SYMPTOMS - answer yes or no to each - every day NO YES  Have you had a fever in the past 24 hours?  . Fever (Temp > 37.80C / 100F) X   Have you had any of these symptoms in the past 24 hours? . New Cough .  Sore Throat  .  Shortness of Breath .  Difficulty Breathing .  Unexplained Body Aches   X   Have you had any one of these symptoms in the past 24 hours not related to allergies?   . Runny Nose .  Nasal Congestion .  Sneezing   X   If you have had runny nose, nasal congestion, sneezing in the past 24 hours, has it worsened?  X   EXPOSURES - check yes or no X   Have you traveled outside the state in the past 14 days?  X   Have you been in contact with someone with a confirmed diagnosis of COVID-19 or PUI in the past 14 days without wearing appropriate PPE?  X   Have you been living in the same home as a person with confirmed diagnosis of COVID-19 or a PUI (household contact)?    X   Have you been diagnosed with COVID-19?    X              What to do next: Answered NO to all: Answered YES to anything:   Proceed with unit schedule Follow the [facility] Inpatient Flowsheet.

## 2019-04-27 NOTE — Progress Notes (Signed)
D. Pt alert and oriented x 4,  spending most of  the time in her room. Denies SI, HI.  Compliant with her medications. Stated  That her goal for today is to call her workers comp Tourist information centre manager.  A. Support and encouragement provided. Q15 minutes safety checks continued.  R. Will continue to monitor.
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
## 2019-04-27 NOTE — Progress Notes (Signed)
Adult Psychoeducational Group Note  Date:  04/27/2019 Time:  10:28 AM  Group Topic/Focus:  Goals Group:   The focus of this group is to help patients establish daily goals to achieve during treatment and discuss how the patient can incorporate goal setting into their daily lives to aide in recovery. Orientation:   The focus of this group is to educate the patient on the purpose and policies of crisis stabilization and provide a format to answer questions about their admission.  The group details unit policies and expectations of patients while admitted.  Participation Level:  Active  Participation Quality:  Appropriate  Affect:  Appropriate  Cognitive:  Alert  Insight: Appropriate  Engagement in Group:  Engaged  Modes of Intervention:  Discussion and Education  Additional Comments:    Pt participated in group. During group MHT discussed rules for the unit along with today's schedule. Today's topic of the day is mindfulness. MHT discussed what is mindfulness and different mindfulness techniques that patients can use to cope with anxiety and depression.   Lita Mains 04/27/2019, 10:28 AM

## 2019-04-27 NOTE — [facility] Group Notes (Signed)
Pt did not attend wrap up group this evening. Pt was asleep in bed.  

## 2019-04-27 NOTE — Progress Notes (Signed)
   04/27/19 2321  Psych Admission Type (Psych Patients Only)  Admission Status Involuntary  Psychosocial Assessment  Patient Complaints Depression  Eye Contact Fair  Facial Expression Flat  Affect Depressed  Speech Logical/coherent  Interaction Minimal  Motor Activity Other (Comment) (in bed)  Appearance/Hygiene Unremarkable  Behavior Characteristics Cooperative  Mood Depressed  Thought Process  Coherency WDL  Content WDL  Delusions None reported or observed  Perception WDL  Hallucination None reported or observed  Judgment WDL  Confusion None  Danger to Self  Current suicidal ideation? Denies  Danger to Others  Danger to Others None reported or observed  D: Patient in room resting on approach. Pt denies any needs at the moment.   A: Support and encouragement provided as needed.  R: Patient remains safe on the unit. Will continue to monitor for safety and stability.

## 2019-04-27 NOTE — Progress Notes (Signed)
Recreation Therapy Notes  Animal-Assisted Activity (AAA) Program Checklist/Progress Notes Patient Eligibility Criteria Checklist & Daily Group note for Rec Tx Intervention  Date: 12.8.20 Time: 93 Location: [address]   AAA/T Program Assumption of Risk Form signed by Teacher, music or Parent Legal Guardian  YES   Patient is free of allergies or sever asthma YES   Patient reports no fear of animals YES   Patient reports no history of cruelty to animals YES   Patient understands his/her participation is voluntary YES   Patient washes hands before animal contact YES   Patient washes hands after animal contact YES   Education: Contractor, Appropriate Animal Interaction   Education Outcome: Acknowledges understanding/In group clarification offered/Needs additional education.   Clinical Observations/Feedback:  Pt did not attend group activity.    [name], LRT/CTRS         [name] A 04/27/2019 3:39 PM

## 2019-04-28 MED ORDER — FLUOXETINE HCL 20 MG PO CAPS: 60.0000 mg | ORAL_CAPSULE | Freq: Every day | ORAL | 0 refills | Status: AC

## 2019-04-28 MED ORDER — LAMOTRIGINE 100 MG PO TABS: 100.0000 mg | ORAL_TABLET | Freq: Every day | ORAL | 0 refills | Status: AC

## 2019-04-28 MED ORDER — LURASIDONE HCL 40 MG PO TABS: 40.0000 mg | ORAL_TABLET | Freq: Every day | ORAL | 0 refills | Status: DC

## 2019-04-28 NOTE — Progress Notes (Signed)
D:  Patient denied SI and HI, contracts for safety.  Denied A/V hallucinations.   A:  Medications administered per MD orders.  Emotional support and encouragement given patient. R:  Safety maintained with 15 minute checks.  

## 2019-04-28 NOTE — [facility] Suicide Risk Assessment (Signed)
[facility] Discharge Suicide Risk Assessment   Principal Problem: MDD (major depressive disorder) Discharge Diagnoses: Principal Problem:   MDD (major depressive disorder)   Total Time spent with patient: 30 minutes  Musculoskeletal: Strength & Muscle Tone: within normal limits Gait & Station: normal Patient leans: N/A  Psychiatric Specialty Exam: ROS no headache, no chest pain, no shortness of breath, no vomiting, no fever or chills   Blood pressure 108/86, pulse 89, temperature 97.9 F (36.6 C), resp. rate 16, height 5\' 6"  (1.676 m), weight 98.9 kg, SpO2 98 %.Body mass index is 35.19 kg/m.  General Appearance: improved grooming   Eye Contact::  Good  Speech:  Normal Rate409  Volume:  Normal  Mood:  improving mood   Affect:  improved range of affect   Thought Process:  Linear and Descriptions of Associations: Intact  Orientation:  Full (Time, Place, and Person)  Thought Content:  no hallucinations, no delusions   Suicidal Thoughts:  No denies suicidal or self injurious ideations, denies homicidal or violent ideations  Homicidal Thoughts:  No  Memory:  recent and remote grossly intact   Judgement:  Other:  improving  Insight:  improving   Psychomotor Activity:  Normal  Concentration:  Good  Recall:  Good  Fund of Knowledge:Good  Language: Good  Akathisia:  Negative  Handed:  Right  AIMS (if indicated):     Assets:  Desire for Improvement Resilience  Sleep:  Number of Hours: 5.25  Cognition: WNL  ADL's:  Intact   Mental Status Per Nursing Assessment::   On Admission:  Suicidal ideation indicated by patient  Demographic Factors:  6 y old female   Loss Factors: Reports recent job related injury, now out of work  Historical Factors: History of prior bipolar disorder diagnosis, history of prior psychiatric admission in 2015  Risk Reduction Factors:   Sense of responsibility to family and Positive coping skills or problem solving skills  Continued Clinical Symptoms:   At this time patient is alert, attentive, well related, pleasant, cooperative. Mood improved , affect fuller in range, states " I am feeling better". No thought disorder, denies suicidal or self injurious ideations, denies homicidal or violent ideations, no hallucinations, no delusions, not internally preoccupied . Tolerating medications well . We reviewed side effect profile , to include potential risk of rash on lamotrigine, metabolic, motor side effects, risk on NMS on Latuda.  Behavior on unit in good control, pleasant on approach.   Cognitive Features That Contribute To Risk:  No gross cognitive deficits noted upon discharge. Is alert , attentive, and oriented x 3   Suicide Risk:  Mild:  Suicidal ideation of limited frequency, intensity, duration, and specificity.  There are no identifiable plans, no associated intent, mild dysphoria and related symptoms, good self-control (both objective and subjective assessment), few other risk factors, and identifiable protective factors, including available and accessible social support.  Follow-up Information    [facility]. Go on 05/05/2019.   Why: Your appointment is Wednesday, 05/05/19 at 3:45am with 05/07/19. Please be sure to bring any discharge paperwork from your hospitalization. Please call if you have any additional questions or concerns.  Contact information: [address] [place] [phone]           Plan Of Care/Follow-up recommendations:  Activity:  as tolerated Diet:  regular Tests:  NA Other:  See below Patient is expressing readiness for discharge and is leaving unit in good spirits, no current grounds for involuntary commitment- she  plans to  return home Plans to follow up as above Jenne Campus, MD 04/28/2019, 11:54 AM

## 2019-04-28 NOTE — Progress Notes (Signed)
Discharge Note:  Patient discharged home with family member.  Patient denied SI and HI.  Denied A/V hallucinations.  Suicide prevention information given and discussed with patient who stated she understood and had no questions.  Patient stated she received all her belongings, clothing, toiletries, misc items, etc.  Patient stated she  appreciated all assistance received from [facility] staff.  All required discharge information given to patient at discharge.  

## 2019-04-28 NOTE — Discharge Summary (Addendum)
Physician Discharge Summary Note  Patient:  Jamie Avery is an 33 y.o., female MRN:  [id number] DOB:  1985-11-21 Patient phone:  [phone] (home)  Patient address:   [address] [place] [id number],  Total Time spent with patient: 15 minutes  Date of Admission:  04/25/2019 Date of Discharge: 04/28/19  Reason for Admission:  suicidal ideation  Principal Problem: MDD (major depressive disorder) Discharge Diagnoses: Principal Problem:   MDD (major depressive disorder)   Past Psychiatric History: Patient [facility] recently.  She was originally diagnosed with bipolar disorder in 2015.  She has had no other psychiatric hospitalizations since that first hospitalization at [facility] in 2015.  Prior to that she had 4 previous psychiatric admissions.  There is some suspect of underlying borderline personality disorder.  Past Medical History:  Past Medical History:  Diagnosis Date  . Anxiety   . Depression     Past Surgical History:  Procedure Laterality Date  . ADENOIDECTOMY     Family History:  Family History  Family history unknown: Yes   Family Psychiatric  History: Denies Social History:  Social History   Substance and Sexual Activity  Alcohol Use No     Social History   Substance and Sexual Activity  Drug Use Not Currently  . Types: Opium, Heroin    Social History   Socioeconomic History  . Marital status: Single    Spouse name: Not on file  . Number of children: Not on file  . Years of education: Not on file  . Highest education level: Not on file  Occupational History  . Occupation: Unemployed  Social Needs  . Financial resource strain: Not on file  . Food insecurity    Worry: Not on file    Inability: Not on file  . Transportation needs    Medical: Not on file    Non-medical: Not on file  Tobacco Use  . Smoking status: Current Every Day Smoker    Packs/day: 0.50    Types: Cigarettes  . Smokeless  tobacco: Never Used  Substance and Sexual Activity  . Alcohol use: No  . Drug use: Not Currently    Types: Opium, Heroin  . Sexual activity: Not Currently  Lifestyle  . Physical activity    Days per week: Not on file    Minutes per session: Not on file  . Stress: Not on file  Relationships  . Social Musicianconnections    Talks on phone: Not on file    Gets together: Not on file    Attends religious service: Not on file    Active member of club or organization: Not on file    Attends meetings of clubs or organizations: Not on file    Relationship status: Not on file  Other Topics Concern  . Not on file  Social History Narrative   [place] lives in [place] with roommate.  Currently unemployed due to foot injury.  [facility] of depression and Bipolar I disorder.    Hospital Course:  From admission H&P: Patient is a 33 year old female who presented as a voluntary walk-in patient on 04/25/2019 with suicidal ideation and depression. The patient stated that she had been previously diagnosed with bipolar disorder, although she does not agree with that diagnosis. [facility]. She was last seen there within the last 4 to 6 weeks. She complained of depressive symptoms at that time, and her  antidepressant medications were increased. She stated that she has had increased stressors secondary to financial problems. She was working at a job, and then became injured. She is not been able to work since then. Her short-term disability has not come through as well, and so the financial stressors just added onto things. Her last psychiatric hospitalization was in 2015. She was diagnosed with bipolar disorder at that time. She was admitted secondary to self-injurious behaviors. Her discharge medications at that time included Tegretol, Lexapro, olanzapine. The patient reported that she became significantly more suicidal recently, and on 04/24/2019  considered overdosing on tablets. She stated the only reason why it prevented her was the fact she was afraid she would fail at that. She admitted to helplessness, hopelessness and worthlessness. Decision was made to admit her to the hospital for evaluation and stabilization.  Of note, her history and physical from 04/26/2014 stated that she was a very unreliable historian.  She had initially denied abusing alcohol and drugs, but did later admit to having snorted 10 to 15 tablets of opiates later during that hospitalization.  Jamie Avery was admitted for depression with suicidal ideation related to financial stressors. UDS was negative. She remained on the [facility] unit for three days. Prozac and Latuda were increased. She participated in group therapy on the unit. She responded well to treatment with no adverse effects reported. She has shown improved mood, affect, sleep, and interaction. She is requesting discharge. She is future-oriented, with plans to continue working on receiving worker's compensation for recent injury. She reports increased hopefulness and clearer thinking. She denies any SI/HI/AVH and contracts for safety. She agrees to follow up at [facility] (see below). She is provided with prescriptions for medications upon discharge. Her friend is picking her up for discharge home.   Physical Findings: AIMS:  , ,  ,  ,    CIWA:    COWS:  COWS Total Score: 0  Musculoskeletal: Strength & Muscle Tone: within normal limits Gait & Station: normal Patient leans: N/A  Psychiatric Specialty Exam: Physical Exam  Nursing note and vitals reviewed. Constitutional: She is oriented to person, place, and time. She appears well-developed and well-nourished.  Cardiovascular: Normal rate.  Respiratory: Effort normal.  Neurological: She is alert and oriented to person, place, and time.    Review of Systems  Constitutional: Negative.   Respiratory: Negative for cough and shortness of breath.    Cardiovascular: Negative for chest pain.  Psychiatric/Behavioral: Positive for depression (stable on medication). Negative for hallucinations, substance abuse and suicidal ideas. The patient is not nervous/anxious and does not have insomnia.     Blood pressure 108/86, pulse 89, temperature 97.9 F (36.6 C), resp. rate 16, height 5\' 6"  (1.676 m), weight 98.9 kg, SpO2 98 %.Body mass index is 35.19 kg/m.  See MD's discharge SRA      Has this patient used any form of tobacco in the last 30 days? (Cigarettes, Smokeless Tobacco, Cigars, and/or Pipes)  No  Blood Alcohol level:  No results found for: [facility]  Metabolic Disorder Labs:  Lab Results  Component Value Date   HGBA1C 5.3 04/26/2019   MPG 105.41 04/26/2019   No results found for: PROLACTIN Lab Results  Component Value Date   CHOL 188 04/26/2019   TRIG 62 04/26/2019   HDL 65 04/26/2019   CHOLHDL 2.9 04/26/2019   VLDL 12 04/26/2019   LDLCALC 111 (H) 04/26/2019    See Psychiatric Specialty Exam and Suicide Risk Assessment completed by Attending Physician  prior to discharge.  Discharge destination:  Home  Is patient on multiple antipsychotic therapies at discharge:  No   Has Patient had three or more failed trials of antipsychotic monotherapy by history:  No  Recommended Plan for Multiple Antipsychotic Therapies: NA  Discharge Instructions    Discharge instructions   Complete by: As directed    Patient is instructed to take all prescribed medications as recommended. Report any side effects or adverse reactions to your outpatient psychiatrist. Patient is instructed to abstain from alcohol and illegal drugs while on prescription medications. In the event of worsening symptoms, patient is instructed to call the crisis hotline, 911, or go to the nearest emergency department for evaluation and treatment.     Allergies as of 04/28/2019   No Known Allergies     Medication List    TAKE these medications     Indication   FLUoxetine 20 MG capsule Commonly known as: PROZAC Take 3 capsules (60 mg total) by mouth daily. Start taking on: April 29, 2019 What changed: how much to take  Indication: Depression   lamoTRIgine 100 MG tablet Commonly known as: LAMICTAL Take 1 tablet (100 mg total) by mouth daily. Start taking on: April 29, 2019 What changed: when to take this  Indication: Manic-Depression   lurasidone 40 MG Tabs tablet Commonly known as: LATUDA Take 1 tablet (40 mg total) by mouth daily with breakfast. Start taking on: April 29, 2019 What changed:   medication strength  how much to take  when to take this  Indication: Depressive Phase of Manic-Depression      Follow-up Information    [facility]. Go on 05/05/2019.   Why: Your appointment is Wednesday, 05/05/19 at 3:45am with Merlyn Albert. Please be sure to bring any discharge paperwork from your hospitalization. Please call if you have any additional questions or concerns.  Contact information: [address] [place] [address] [phone]           Follow-up recommendations: Activity as tolerated. Diet as recommended by primary care physician. Keep all scheduled follow-up appointments as recommended.   Comments:   Patient is instructed to take all prescribed medications as recommended. Report any side effects or adverse reactions to your outpatient psychiatrist. Patient is instructed to abstain from alcohol and illegal drugs while on prescription medications. In the event of worsening symptoms, patient is instructed to call the crisis hotline, 911, or go to the nearest emergency department for evaluation and treatment.  Signed: Aldean Baker, NP 04/28/2019, 9:48 AM   Patient seen, Suicide Assessment Completed.  Disposition Plan Reviewed

## 2019-04-28 NOTE — Progress Notes (Signed)
  [facility] Adult Case Management Discharge Plan :  Will you be returning to the same living situation after discharge:  Yes,  patient is returning home with her roommate At discharge, do you have transportation home?: Yes,  patient's close friend will pick her up Do you have the ability to pay for your medications: Yes,  BCBS  Release of information consent forms completed and in the chart;  Patient's signature needed at discharge.  Patient to Follow up at: Follow-up Information    [facility]. Go on 05/05/2019.   Why: Your appointment is Wednesday, 05/05/19 at 3:45am with Josph Macho. Please be sure to bring any discharge paperwork from your hospitalization. Please call if you have any additional questions or concerns.  Contact information: [address] [place] [address] [phone]           Next level of care provider has access to [place] and Suicide Prevention discussed: Yes,  with the patient     Has patient been referred to the Quitline?: N/A patient is not a smoker  Patient has been referred for addiction treatment: N/A  Marylee Floras, LCSWA 04/28/2019, 10:16 AM

## 2019-05-20 ENCOUNTER — Ambulatory Visit: Payer: Medicaid Other | Attending: Internal Medicine

## 2019-05-20 DIAGNOSIS — Z20822 Contact with and (suspected) exposure to covid-19: Secondary | ICD-10-CM

## 2019-05-22 LAB — NOVEL CORONAVIRUS, NAA: SARS-CoV-2, NAA: NOT DETECTED

## 2020-02-09 ENCOUNTER — Other Ambulatory Visit: Payer: Self-pay | Admitting: Nurse Practitioner

## 2020-02-09 DIAGNOSIS — U071 COVID-19: Secondary | ICD-10-CM

## 2020-02-09 NOTE — Progress Notes (Signed)
I connected by phone with Jamie Avery on 02/09/2020 at 3:51 PM to discuss the potential use of a new treatment for mild to moderate COVID-19 viral infection in non-hospitalized patients.  This patient is a 34 y.o. female that meets the FDA criteria for Emergency Use Authorization of COVID monoclonal antibody casirivimab/imdevimab.  Has a (+) direct SARS-CoV-2 viral test result  Has mild or moderate COVID-19   Is NOT hospitalized due to COVID-19  Is within 10 days of symptom onset  Has at least one of the high risk factor(s) for progression to severe COVID-19 and/or hospitalization as defined in EUA.  Specific high risk criteria : BMI > 25   I have spoken and communicated the following to the patient or parent/caregiver regarding COVID monoclonal antibody treatment:  1. FDA has authorized the emergency use for the treatment of mild to moderate COVID-19 in adults and pediatric patients with positive results of direct SARS-CoV-2 viral testing who are 25 years of age and older weighing at least 40 kg, and who are at high risk for progressing to severe COVID-19 and/or hospitalization.  2. The significant known and potential risks and benefits of COVID monoclonal antibody, and the extent to which such potential risks and benefits are unknown.  3. Information on available alternative treatments and the risks and benefits of those alternatives, including clinical trials.  4. Patients treated with COVID monoclonal antibody should continue to self-isolate and use infection control measures (e.g., wear mask, isolate, social distance, avoid sharing personal items, clean and disinfect "high touch" surfaces, and frequent handwashing) according to CDC guidelines.   5. The patient or parent/caregiver has the option to accept or refuse COVID monoclonal antibody treatment.  After reviewing this information with the patient, The patient agreed to proceed with receiving casirivimab\imdevimab infusion and  will be provided a copy of the Fact sheet prior to receiving the infusion. Jake Samples Pickenpack-Cousar 02/09/2020 3:51 PM

## 2020-02-10 ENCOUNTER — Ambulatory Visit (HOSPITAL_COMMUNITY)
Admission: RE | Admit: 2020-02-10 | Discharge: 2020-02-10 | Disposition: A | Discharge status: Home / Self Care | Payer: Medicaid Other | Source: Ambulatory Visit | Attending: Pulmonary Disease | Admitting: Pulmonary Disease

## 2020-02-10 ENCOUNTER — Other Ambulatory Visit (HOSPITAL_COMMUNITY): Payer: Self-pay

## 2020-02-10 DIAGNOSIS — U071 COVID-19: Secondary | ICD-10-CM | POA: Insufficient documentation

## 2020-02-10 MED ORDER — METHYLPREDNISOLONE SODIUM SUCC 125 MG IJ SOLR: 125.0000 mg | Freq: Once | INTRAMUSCULAR | Status: DC | PRN

## 2020-02-10 MED ORDER — EPINEPHRINE 0.3 MG/0.3ML IJ SOAJ: 0.3000 mg | Freq: Once | INTRAMUSCULAR | Status: DC | PRN

## 2020-02-10 MED ORDER — DIPHENHYDRAMINE HCL 50 MG/ML IJ SOLN: 50.0000 mg | Freq: Once | INTRAMUSCULAR | Status: DC | PRN

## 2020-02-10 MED ORDER — FAMOTIDINE IN NACL 20-0.9 MG/50ML-% IV SOLN: 20.0000 mg | Freq: Once | INTRAVENOUS | Status: DC | PRN

## 2020-02-10 MED ORDER — SODIUM CHLORIDE 0.9 % IV SOLN
1200.0000 mg | Freq: Once | INTRAVENOUS | Status: AC
  Administered 2020-02-10: 1200 mg via INTRAVENOUS

## 2020-02-10 MED ORDER — SODIUM CHLORIDE 0.9 % IV SOLN: INTRAVENOUS | Status: DC | PRN

## 2020-02-10 MED ORDER — ALBUTEROL SULFATE HFA 108 (90 BASE) MCG/ACT IN AERS: 2.0000 | INHALATION_SPRAY | Freq: Once | RESPIRATORY_TRACT | Status: DC | PRN

## 2020-02-10 NOTE — Progress Notes (Signed)
  Diagnosis: COVID-19  Physician:Dr Wright   Procedure: [facility] Med: casirivimab\imdevimab infusion - Provided patient with casirivimab\imdevimab fact sheet for patients, parents and caregivers prior to infusion.  Complications: No immediate complications noted.  Discharge: Discharged home   Bernadene Garside W 02/10/2020  

## 2020-02-10 NOTE — Discharge Instructions (Signed)

## 2020-03-07 ENCOUNTER — Other Ambulatory Visit: Payer: Self-pay

## 2020-03-07 ENCOUNTER — Emergency Department (HOSPITAL_COMMUNITY)
Admission: EM | Admit: 2020-03-07 | Discharge: 2020-03-08 | Disposition: A | Discharge status: Home / Self Care | Payer: Medicaid Other | Attending: Emergency Medicine | Admitting: Emergency Medicine

## 2020-03-07 ENCOUNTER — Encounter (HOSPITAL_COMMUNITY): Payer: Self-pay | Admitting: Emergency Medicine

## 2020-03-07 DIAGNOSIS — K0889 Other specified disorders of teeth and supporting structures: Secondary | ICD-10-CM | POA: Insufficient documentation

## 2020-03-07 DIAGNOSIS — Z5321 Procedure and treatment not carried out due to patient leaving prior to being seen by health care provider: Secondary | ICD-10-CM | POA: Insufficient documentation

## 2020-03-07 NOTE — ED Notes (Signed)
Pt did not want to wait has to work in the morning.

## 2020-03-07 NOTE — ED Triage Notes (Signed)
Pt reports having minor dental pain for years however believes it now in infected due to the amount of pain it is causing her.  She has an appointment on November 16 however she "needs antibiotics to stop the infection."  Had COVID three weeks ago

## 2020-08-01 ENCOUNTER — Other Ambulatory Visit: Payer: Self-pay

## 2020-08-01 ENCOUNTER — Emergency Department (INDEPENDENT_AMBULATORY_CARE_PROVIDER_SITE_OTHER)
Admission: RE | Admit: 2020-08-01 | Discharge: 2020-08-01 | Disposition: A | Discharge status: Home / Self Care | Payer: 59 | Source: Ambulatory Visit | Attending: Family Medicine | Admitting: Family Medicine

## 2020-08-01 VITALS — Ht 66.0 in | Wt 225.0 lb

## 2020-08-01 DIAGNOSIS — G43009 Migraine without aura, not intractable, without status migrainosus: Secondary | ICD-10-CM | POA: Diagnosis not present

## 2020-08-01 MED ORDER — DEXAMETHASONE SODIUM PHOSPHATE 10 MG/ML IJ SOLN
10.0000 mg | Freq: Once | INTRAMUSCULAR | Status: AC
  Administered 2020-08-01: 10 mg via INTRAMUSCULAR

## 2020-08-01 MED ORDER — KETOROLAC TROMETHAMINE 60 MG/2ML IM SOLN
60.0000 mg | Freq: Once | INTRAMUSCULAR | Status: AC
  Administered 2020-08-01: 60 mg via INTRAMUSCULAR

## 2020-08-01 MED ORDER — METOCLOPRAMIDE HCL 5 MG/ML IJ SOLN
5.0000 mg | Freq: Once | INTRAMUSCULAR | Status: AC
  Administered 2020-08-01: 5 mg via INTRAMUSCULAR

## 2020-08-01 NOTE — Discharge Instructions (Addendum)
Rest.  Increase fluid intake.  If symptoms become significantly worse during the night or over the weekend, proceed to the local emergency room.  

## 2020-08-01 NOTE — ED Triage Notes (Signed)
Headache started yesterday, took Excedrin Migraine with no relief.  Vaccinated

## 2020-08-01 NOTE — ED Provider Notes (Signed)
[facility]    CSN: [id number] Arrival date & time: 08/01/20  1251      History   Chief Complaint Chief Complaint  Patient presents with  . Headache    HPI Jamie Avery is a 35 y.o. female.   Patient reports that she has been having mild daily headaches for about two months, as well as several migraine headaches that had been relieved with Excedrin Migraine.  Yesterday she developed a migraine that has not responded to Excedrin.  She describes pressure behind her eyes and nausea without vomiting.  She denies fever and other neurologic symptoms. She states that she had frequent migraines until the age of 89.  The history is provided by the patient.  Headache Pain location:  Generalized Quality:  Dull Radiates to:  Does not radiate Onset quality:  Sudden Duration:  1 day Timing:  Constant Progression:  Worsening Chronicity:  Recurrent Similar to prior headaches: yes   Context: activity and bright light   Relieved by:  Nothing Worsened by:  Light and activity Ineffective treatments:  Resting in a darkened room (Excedrin Migraine) Associated symptoms: nausea and photophobia   Associated symptoms: no blurred vision, no drainage, no ear pain, no eye pain, no facial pain, no fatigue, no fever, no focal weakness, no hearing loss, no loss of balance, no myalgias, no near-syncope, no neck pain, no neck stiffness, no numbness, no paresthesias, no seizures, no sinus pressure, no sore throat, no swollen glands, no syncope, no tingling, no URI, no visual change, no vomiting and no weakness     Past Medical History:  Diagnosis Date  . Anxiety   . Depression     Patient Active Problem List   Diagnosis Date Noted  . MDD (major depressive disorder) 04/25/2019    Past Surgical History:  Procedure Laterality Date  . ADENOIDECTOMY      OB History   No obstetric history on file.      Home Medications    Prior to Admission medications   Medication Sig Start Date  End Date Taking? Authorizing Provider  FLUoxetine (PROZAC) 20 MG capsule Take 3 capsules (60 mg total) by mouth daily. 04/29/19   Aldean Baker, NP  lamoTRIgine (LAMICTAL) 100 MG tablet Take 1 tablet (100 mg total) by mouth daily. 04/29/19   Aldean Baker, NP    Family History Family History  Family history unknown: Yes    Social History Social History   Tobacco Use  . Smoking status: Current Every Day Smoker    Packs/day: 0.50    Types: Cigarettes  . Smokeless tobacco: Never Used  Vaping Use  . Vaping Use: Every day  . Substances: Nicotine, Flavoring  Substance Use Topics  . Alcohol use: No  . Drug use: Not Currently    Types: Opium, Heroin     Allergies   Patient has no known allergies.   Review of Systems Review of Systems  Constitutional: Negative for fatigue and fever.  HENT: Negative for ear pain, hearing loss, postnasal drip, sinus pressure and sore throat.   Eyes: Positive for photophobia. Negative for blurred vision and pain.  Cardiovascular: Negative for syncope and near-syncope.  Gastrointestinal: Positive for nausea. Negative for vomiting.  Musculoskeletal: Negative for myalgias, neck pain and neck stiffness.  Neurological: Positive for headaches. Negative for tremors, focal weakness, seizures, syncope, speech difficulty, weakness, light-headedness, numbness, paresthesias and loss of balance.  All other systems reviewed and are negative.    Physical Exam Triage Vital Signs  ED Triage Vitals  Enc Vitals Group     BP 08/01/20 1307 (P) 133/88     Pulse Rate 08/01/20 1307 (P) 82     Resp 08/01/20 1307 (P) 18     Temp 08/01/20 1307 (P) 98.8 F (37.1 C)     Temp Source 08/01/20 1307 (P) Oral     SpO2 08/01/20 1307 (P) 96 %     Weight 08/01/20 1312 225 lb (102.1 kg)     Height 08/01/20 1312 5\' 6"  (1.676 m)     Head Circumference --      Peak Flow --      Pain Score 08/01/20 1311 7     Pain Loc --      Pain Edu? --      Excl. in GC? --    No data  found.  Updated Vital Signs BP (P) 133/88 (BP Location: Left Arm)   Pulse (P) 82   Temp (P) 98.8 F (37.1 C) (Oral)   Resp (P) 18   Ht 5\' 6"  (1.676 m)   Wt 102.1 kg   LMP 07/29/2020   SpO2 (P) 96%   BMI 36.32 kg/m   Visual Acuity Right Eye Distance:   Left Eye Distance:   Bilateral Distance:    Right Eye Near:   Left Eye Near:    Bilateral Near:     Physical Exam Nursing notes and Vital Signs reviewed. Appearance:  Patient appears stated age, and uncomfortable but in no acute distress.  She is sitting in a darkened room.  Eyes:  Pupils are equal, round, and reactive to light and accomodation.  Extraocular movement is intact.  Conjunctivae are not inflamed.  Mild photophobia present.  Fundi benign   Pharynx:  Normal; moist mucous membranes  Neck:  Supple.  No adenopathy Lungs:  Clear to auscultation.  Breath sounds are equal.  Moving air well. Heart:  Regular rate and rhythm without murmurs, rubs, or gallops.  Abdomen:  Nontender without masses or hepatosplenomegaly.  Bowel sounds are present.  No CVA or flank tenderness.  Extremities:  No edema.  Neurologic:  Cranial nerves 2 through 12 are normal.  Patellar, achilles, and elbow reflexes are normal.  Cerebellar function is intact (finger-to-nose).  No pronator drift.     UC Treatments / Results  Labs (all labs ordered are listed, but only abnormal results are displayed) Labs Reviewed - No data to display  EKG   Radiology No results found.  Procedures Procedures (including critical care time)  Medications Ordered in UC Medications  dexamethasone (DECADRON) injection 10 mg (has no administration in time range)  ketorolac (TORADOL) injection 60 mg (has no administration in time range)  metoCLOPramide (REGLAN) injection 5 mg (has no administration in time range)    Initial Impression / Assessment and Plan / UC Course  I have reviewed the triage vital signs and the nursing notes.  Pertinent labs & imaging  results that were available during my care of the patient were reviewed by me and considered in my medical decision making (see chart for details).    Normal neurologic exam reassuring. Administered Toradol 60mg  IM, Reglan 5mg  IM, and Decadron 10mg  IM. Recommend follow-up with PCP to establish care for headache management.   Final Clinical Impressions(s) / UC Diagnoses   Final diagnoses:  Migraine without aura and without status migrainosus, not intractable     Discharge Instructions     Rest.  Increase fluid intake.  If symptoms become significantly worse during  the night or over the weekend, proceed to the local emergency room.    ED Prescriptions    None        Lattie Haw, MD 08/01/20 1541

## 2021-06-02 ENCOUNTER — Encounter (HOSPITAL_COMMUNITY): Payer: Self-pay | Admitting: Emergency Medicine

## 2021-06-02 ENCOUNTER — Emergency Department (HOSPITAL_COMMUNITY): Payer: 59

## 2021-06-02 ENCOUNTER — Emergency Department (HOSPITAL_COMMUNITY)
Admission: EM | Admit: 2021-06-02 | Discharge: 2021-06-03 | Disposition: A | Discharge status: Home / Self Care | Payer: 59 | Attending: Emergency Medicine | Admitting: Emergency Medicine

## 2021-06-02 DIAGNOSIS — N898 Other specified noninflammatory disorders of vagina: Secondary | ICD-10-CM | POA: Diagnosis not present

## 2021-06-02 DIAGNOSIS — R1032 Left lower quadrant pain: Secondary | ICD-10-CM | POA: Diagnosis present

## 2021-06-02 DIAGNOSIS — R110 Nausea: Secondary | ICD-10-CM | POA: Diagnosis not present

## 2021-06-02 DIAGNOSIS — R109 Unspecified abdominal pain: Secondary | ICD-10-CM

## 2021-06-02 LAB — COMPREHENSIVE METABOLIC PANEL
ALT: 28 U/L (ref 0–44)
AST: 22 U/L (ref 15–41)
Albumin: 4.1 g/dL (ref 3.5–5.0)
Alkaline Phosphatase: 63 U/L (ref 38–126)
Anion gap: 10 (ref 5–15)
BUN: 13 mg/dL (ref 6–20)
CO2: 21 mmol/L — ABNORMAL LOW (ref 22–32)
Calcium: 9.3 mg/dL (ref 8.9–10.3)
Chloride: 105 mmol/L (ref 98–111)
Creatinine, Ser: 0.54 mg/dL (ref 0.44–1.00)
GFR, Estimated: >60 mL/min (ref >60)
Glucose, Bld: 122 mg/dL — ABNORMAL HIGH (ref 70–99)
Potassium: 4.2 mmol/L (ref 3.5–5.1)
Sodium: 136 mmol/L (ref 135–145)
Total Bilirubin: 0.6 mg/dL (ref 0.3–1.2)
Total Protein: 7 g/dL (ref 6.5–8.1)

## 2021-06-02 LAB — CBC
HCT: 43.7 % (ref 36.0–46.0)
Hemoglobin: 14.5 g/dL (ref 12.0–15.0)
MCH: 29.6 pg (ref 26.0–34.0)
MCHC: 33.2 g/dL (ref 30.0–36.0)
MCV: 89.2 fL (ref 80.0–100.0)
Platelets: 346 10*3/uL (ref 150–400)
RBC: 4.9 MIL/uL (ref 3.87–5.11)
RDW: 12.8 % (ref 11.5–15.5)
WBC: 9.2 10*3/uL (ref 4.0–10.5)
nRBC: 0 % (ref 0.0–0.2)

## 2021-06-02 LAB — I-STAT BETA HCG BLOOD, ED (MC, WL, AP ONLY): I-stat hCG, quantitative: <5 m[IU]/mL (ref <5)

## 2021-06-02 LAB — LIPASE, BLOOD: Lipase: 43 U/L (ref 11–51)

## 2021-06-02 MED ORDER — ONDANSETRON HCL 4 MG/2ML IJ SOLN
4.0000 mg | Freq: Once | INTRAMUSCULAR | Status: AC
  Administered 2021-06-02: 4 mg via INTRAVENOUS
  Filled 2021-06-02: qty 2

## 2021-06-02 MED ORDER — KETOROLAC TROMETHAMINE 15 MG/ML IJ SOLN
15.0000 mg | Freq: Once | INTRAMUSCULAR | Status: AC
  Administered 2021-06-02: 15 mg via INTRAVENOUS
  Filled 2021-06-02: qty 1

## 2021-06-02 MED ORDER — SODIUM CHLORIDE 0.9 % IV BOLUS
1000.0000 mL | Freq: Once | INTRAVENOUS | Status: AC
  Administered 2021-06-02: 1000 mL via INTRAVENOUS

## 2021-06-02 MED ORDER — SODIUM CHLORIDE 0.9 % IV BOLUS: 500.0000 mL | Freq: Once | INTRAVENOUS | Status: DC

## 2021-06-02 MED ORDER — IOHEXOL 300 MG/ML  SOLN
100.0000 mL | Freq: Once | INTRAMUSCULAR | Status: AC | PRN
  Administered 2021-06-02: 100 mL via INTRAVENOUS

## 2021-06-02 MED ORDER — SODIUM CHLORIDE 0.9 % IV BOLUS
500.0000 mL | Freq: Once | INTRAVENOUS | Status: AC
  Administered 2021-06-02: 500 mL via INTRAVENOUS

## 2021-06-02 NOTE — ED Provider Notes (Signed)
[facility] DEPT Provider Note   CSN: [id number] Arrival date & time: 06/02/21  1208     History  Chief Complaint  Patient presents with   Abdominal Pain    Jamie Avery is a 36 y.o. female.  Patient with no history of past abdominal surgeries presents to the emergency department for evaluation of left lower quadrant pain.  Symptoms started about 2 days ago.  Pain is intermittent and comes in waves.  She has associated nausea without vomiting.  No chest pain or shortness of breath.  Pain is in the left lateral abdomen with radiation to the left lower abdomen.  She denies dysuria, increased frequency urgency, hematuria.  No constipation or diarrhea.  She has a vaginal discharge but no vaginal bleeding. Remote history of opiate use per chart. The onset of this condition was acute. The course is constant. Aggravating factors: none. Alleviating factors: none.        Home Medications Prior to Admission medications   Medication Sig Start Date End Date Taking? Authorizing Provider  FLUoxetine (PROZAC) 20 MG capsule Take 3 capsules (60 mg total) by mouth daily. 04/29/19   Connye Burkitt, NP  lamoTRIgine (LAMICTAL) 100 MG tablet Take 1 tablet (100 mg total) by mouth daily. 04/29/19   Connye Burkitt, NP      Allergies    Patient has no known allergies.    Review of Systems   Review of Systems  Physical Exam Updated Vital Signs BP 137/85 (BP Location: Right Arm)    Pulse 91    Temp 98.2 F (36.8 C) (Oral)    Resp 17    SpO2 96%   Physical Exam Vitals and nursing note reviewed.  Constitutional:      General: She is in acute distress (uncomfortable).     Appearance: She is well-developed.  HENT:     Head: Normocephalic and atraumatic.     Right Ear: External ear normal.     Left Ear: External ear normal.     Nose: Nose normal.  Eyes:     Conjunctiva/sclera: Conjunctivae normal.  Cardiovascular:     Rate and Rhythm: Normal rate and regular rhythm.      Heart sounds: No murmur heard. Pulmonary:     Effort: No respiratory distress.     Breath sounds: No wheezing, rhonchi or rales.  Abdominal:     Palpations: Abdomen is soft.     Tenderness: There is abdominal tenderness in the left lower quadrant. There is no guarding or rebound.  Musculoskeletal:     Cervical back: Normal range of motion and neck supple.     Right lower leg: No edema.     Left lower leg: No edema.  Skin:    General: Skin is warm and dry.     Findings: No rash.  Neurological:     General: No focal deficit present.     Mental Status: She is alert. Mental status is at baseline.     Motor: No weakness.  Psychiatric:        Mood and Affect: Mood normal.    ED Results / Procedures / Treatments   Labs (all labs ordered are listed, but only abnormal results are displayed) Labs Reviewed  COMPREHENSIVE METABOLIC PANEL - Abnormal; Notable for the following components:      Result Value   CO2 21 (*)    Glucose, Bld 122 (*)    All other components within normal limits  LIPASE, BLOOD  CBC  URINALYSIS, ROUTINE W REFLEX MICROSCOPIC  I-STAT BETA HCG BLOOD, ED ([facility], [facility], [facility] ONLY)    EKG None  Radiology CT ABDOMEN PELVIS W CONTRAST  Result Date: 06/02/2021 CLINICAL DATA:  Left lower quadrant abdominal pain. EXAM: CT ABDOMEN AND PELVIS WITH CONTRAST TECHNIQUE: Multidetector CT imaging of the abdomen and pelvis was performed using the standard protocol following bolus administration of intravenous contrast. RADIATION DOSE REDUCTION: This exam was performed according to the departmental dose-optimization program which includes automated exposure control, adjustment of the mA and/or kV according to patient size and/or use of iterative reconstruction technique. CONTRAST:  160mL OMNIPAQUE IOHEXOL 300 MG/ML  SOLN COMPARISON:  January 03, 2015 FINDINGS: Lower chest: No acute abnormality. Hepatobiliary: No focal liver abnormality is seen. No gallstones, gallbladder wall thickening,  or biliary dilatation. Pancreas: Unremarkable. No pancreatic ductal dilatation or surrounding inflammatory changes. Spleen: Normal in size without focal abnormality. Adrenals/Urinary Tract: Adrenal glands are unremarkable. Kidneys are normal, without renal calculi, focal lesion, or hydronephrosis. Bladder is unremarkable. Stomach/Bowel: Stomach is within normal limits. Appendicolith within the proximal appendix, however no secondary signs of acute appendicitis. No evidence of bowel wall thickening, distention, or inflammatory changes. Vascular/Lymphatic: No significant vascular findings are present. No enlarged abdominal or pelvic lymph nodes. Reproductive: Uterus and bilateral adnexa are unremarkable. Other: No abdominal wall hernia or abnormality. No abdominopelvic ascites. Musculoskeletal: No acute or significant osseous findings. IMPRESSION: 1. No evidence of acute abnormalities within the abdomen or pelvis. 2. Appendicolith within the proximal appendix, however no secondary signs of acute appendicitis. Electronically Signed   By: Fidela Salisbury M.D.   On: 06/02/2021 15:04    Procedures Procedures    Medications Ordered in ED Medications  sodium chloride 0.9 % bolus 500 mL (500 mLs Intravenous Not Given 06/02/21 2329)  iohexol (OMNIPAQUE) 300 MG/ML solution 100 mL (100 mLs Intravenous Contrast Given 06/02/21 1429)  ketorolac (TORADOL) 15 MG/ML injection 15 mg (15 mg Intravenous Given 06/02/21 1823)  ondansetron (ZOFRAN) injection 4 mg (4 mg Intravenous Given 06/02/21 1823)  sodium chloride 0.9 % bolus 500 mL (500 mLs Intravenous New Bag/Given 06/02/21 1823)  sodium chloride 0.9 % bolus 1,000 mL (1,000 mLs Intravenous New Bag/Given 06/02/21 2328)    ED Course/ Medical Decision Making/ A&P    Patient seen and examined. History obtained directly from patient. Work-up including labs, imaging, EKG ordered in triage, if performed, were reviewed.    Labs/EKG: Independently reviewed and interpreted.   This included: CBC with normal white blood cell count, CMP with normal liver and kidney function testing, normal lipase.  UA ordered and is pending.  Patient is not pregnant.  Imaging: Independently reviewed and interpreted.  This included: CT imaging of the abdomen pelvis.  Agree no signs of colitis, diverticulitis.  There is a visible appendicolith without signs of appendicitis --agree this does not fit clinically with patient's presentation.  No signs of ovarian cyst or free pelvic fluid.  Medications/Fluids: Ordered: IV Toradol, IV Zofran, fluid bolus considered administration of: Parenteral narcotics, patient declines  Most recent vital signs reviewed and are as follows: BP 137/85 (BP Location: Right Arm)    Pulse 91    Temp 98.2 F (36.8 C) (Oral)    Resp 17    SpO2 96%   Initial impression: Left lower quadrant pain, unclear etiology  I did discussed pelvic exam with patient given location of pain and vaginal discharge.  Her pain is mainly lateral to the umbilicus, may be slightly below.  She would  like to defer pelvic exam at this time.    Reassessment performed. Patient appears comfortable. Reports toradol worked very well for pain and she feels much better.  She feels like she needs to urinate but is unable, not in distress.  Patient has been unable to urinate despite receiving a fluid bolus.  And drinking fluids.  Bladder scan was 0 cc.   Most current vital signs reviewed and are as follows: BP (!) 153/86 (BP Location: Left Arm)    Pulse 60    Temp 97.8 F (36.6 C) (Axillary)    Resp 17    SpO2 99%   Plan: Will reassess with bedside US.   11:39 PM Reassessment performed. Patient appears comfortable.   Bedside ultrasound used.  Bladder identified.  Volume measurement less than 100 cc.  Will give additional fluid bolus.  Most current vital signs reviewed and are as follows: BP (!) 165/84    Pulse 64    Temp 97.8 F (36.6 C) (Axillary)    Resp 17    SpO2 97%   Plan: Would  like to reassure that patient is able to urinate and check UA given lower abdominal pain.  Patient is in agreement.  She does not want to need to come back if she is unable to urinate.  Signout to [employer] at shift change.   Patient will continue NSAIDs and Tylenol as needed for pain.  The patient was urged to return to the Emergency Department immediately with worsening of current symptoms, worsening abdominal pain, persistent vomiting, blood noted in stools, fever, or any other concerns. The patient verbalized understanding.   Encourage PCP follow-up in the next week for recheck.                          Medical Decision Making  For this patient's complaint of abdominal pain, the following conditions were considered on the differential diagnosis: gastritis/PUD, enteritis/duodenitis, appendicitis, cholelithiasis/cholecystitis, cholangitis, pancreatitis, ruptured viscus, colitis, diverticulitis, proctitis, cystitis, pyelonephritis, ureteral colic, aortic dissection, aortic aneurysm. Atypical chest etiologies were also considered including ACS, PE, and pneumonia. Also considered urinary retention given patient's difficulty with urination.  The patient's vital signs, pertinent lab work and imaging were reviewed and interpreted as discussed in the ED course. As patient is well-appearing, has a stable exam, and reassuring studies today, I do not feel that they warrant admission to the hospital for further testing, treatment or serial exams. This plan was discussed with the patient who verbalizes agreement and comfort with this plan and seems reliable and able to return to the Emergency Department with worsening or changing symptoms.          Final Clinical Impression(s) / ED Diagnoses Final diagnoses:  Left sided abdominal pain    Rx / DC Orders ED Discharge Orders     None         Carlisle Cater, PA-C 06/03/21 1557    Tegeler, Gwenyth Allegra, MD 06/03/21 1751

## 2021-06-02 NOTE — ED Provider Triage Note (Signed)
Emergency Medicine Provider Triage Evaluation Note  Jamie Avery , a 36 y.o. female  was evaluated in triage.  Pt complains of LLQ abdominal pain for the past 2.5days with multiple normal consistency Bms >10e/day. She reports it is dark in color, but denies tarry consistency. She reports she feels like she needs to have a BM but can not produce anything for the past 12 hours. Roommate's daughter with recent stomach bug. She denies any nausea, vomiting, urinary symptoms, or fever..  Review of Systems  Positive: Frequent Bms, abdominal pain  Negative: Nausea, vomiting, fever, chest pain, SOB, urinary complaints  Physical Exam  BP (!) 170/105    Pulse 85    Temp 98.2 F (36.8 C) (Oral)    Resp 20    SpO2 98%  Gen:   Awake, no distress , appears uncomfortable Resp:  Normal effort  MSK:   Moves extremities without difficulty  Other:  [place] to palpation of the LLQ. No guarding or rebound. Non distended. Active bowel sounds.  Medical Decision Making  Medically screening exam initiated at 12:21 PM.  Appropriate orders placed.  Jamie Avery was informed that the remainder of the evaluation will be completed by another provider, this initial triage assessment does not replace that evaluation, and the importance of remaining in the ED until their evaluation is complete.  Abdominal labs placed. CT abdomen placed.   Sherrell Puller, PA-C 06/02/21 1225

## 2021-06-02 NOTE — Discharge Instructions (Signed)
Please read and follow all provided instructions.  Your diagnoses today include:  1. Left sided abdominal pain     Tests performed today include: Blood cell counts and platelets: White blood cell count was normal Kidney and liver function tests: Normal Pancreas function test (called lipase): Was normal Vital signs. See below for your results today.   Medications prescribed:  None  Take any prescribed medications only as directed.  Home care instructions:  Follow any educational materials contained in this packet. Use Tylenol and ibuprofen/Aleve as directed on packaging for pain  Follow-up instructions: Please follow-up with your primary care provider in the next 7 days for further evaluation of your symptoms.    Return instructions:  SEEK IMMEDIATE MEDICAL ATTENTION IF: The pain does not go away or becomes severe  A temperature above 101F develops  Repeated vomiting occurs (multiple episodes)  The pain becomes localized to portions of the abdomen. The right side could possibly be appendicitis. In an adult, the left lower portion of the abdomen could be colitis or diverticulitis.  Blood is being passed in stools or vomit (bright red or black tarry stools)  You develop chest pain, difficulty breathing, dizziness or fainting, or become confused, poorly responsive, or inconsolable (young children) If you have any other emergent concerns regarding your health  Additional Information: Abdominal (belly) pain can be caused by many things. Your caregiver performed an examination and possibly ordered blood/urine tests and imaging (CT scan, x-rays, ultrasound). Many cases can be observed and treated at home after initial evaluation in the emergency department. Even though you are being discharged home, abdominal pain can be unpredictable. Therefore, you need a repeated exam if your pain does not resolve, returns, or worsens. Most patients with abdominal pain don't have to be admitted to the  hospital or have surgery, but serious problems like appendicitis and gallbladder attacks can start out as nonspecific pain. Many abdominal conditions cannot be diagnosed in one visit, so follow-up evaluations are very important.  Your vital signs today were: BP (!) 165/84    Pulse 64    Temp 97.8 F (36.6 C) (Axillary)    Resp 17    SpO2 97%  If your blood pressure (bp) was elevated above 135/85 this visit, please have this repeated by your doctor within one month. --------------

## 2021-06-02 NOTE — ED Triage Notes (Signed)
Patient c/o LLQ pain x2 days with dark stool.

## 2021-06-02 NOTE — ED Notes (Signed)
Bladder scanner detects 68ml in pt. bladder

## 2021-06-03 LAB — URINALYSIS, ROUTINE W REFLEX MICROSCOPIC
Bilirubin Urine: NEGATIVE
Glucose, UA: NEGATIVE mg/dL
Ketones, ur: NEGATIVE mg/dL
Nitrite: NEGATIVE
Protein, ur: NEGATIVE mg/dL
Specific Gravity, Urine: 1.028 (ref 1.005–1.030)
pH: 5 (ref 5.0–8.0)

## 2021-06-03 NOTE — ED Provider Notes (Signed)
2:15 AM UA negative for UTI. Given reassuring work up, plan for discharge to have patient f/u with her PCP. Departed ED in stable condition.  Today's Vitals   06/02/21 2200 06/02/21 2330 06/03/21 0127 06/03/21 0221  BP: (!) 165/84 (!) 141/91 (!) 148/83 116/68  Pulse: 64 64 64 78  Resp: 17 16 18 17   Temp:   98.2 F (36.8 C) 97.6 F (36.4 C)  TempSrc:   Oral   SpO2: 97% 96% 97% 95%  PainSc:    2    Results for orders placed or performed during the hospital encounter of 06/02/21  Lipase, blood  Result Value Ref Range   Lipase 43 11 - 51 U/L  Comprehensive metabolic panel  Result Value Ref Range   Sodium 136 135 - 145 mmol/L   Potassium 4.2 3.5 - 5.1 mmol/L   Chloride 105 98 - 111 mmol/L   CO2 21 (L) 22 - 32 mmol/L   Glucose, Bld 122 (H) 70 - 99 mg/dL   BUN 13 6 - 20 mg/dL   Creatinine, Ser 06/04/21 0.44 - 1.00 mg/dL   Calcium 9.3 8.9 - 6.80 mg/dL   Total Protein 7.0 6.5 - 8.1 g/dL   Albumin 4.1 3.5 - 5.0 g/dL   AST 22 15 - 41 U/L   ALT 28 0 - 44 U/L   Alkaline Phosphatase 63 38 - 126 U/L   Total Bilirubin 0.6 0.3 - 1.2 mg/dL   GFR, Estimated 32.1 >22 mL/min   Anion gap 10 5 - 15  CBC  Result Value Ref Range   WBC 9.2 4.0 - 10.5 K/uL   RBC 4.90 3.87 - 5.11 MIL/uL   Hemoglobin 14.5 12.0 - 15.0 g/dL   HCT >48 25.0 - 03.7 %   MCV 89.2 80.0 - 100.0 fL   MCH 29.6 26.0 - 34.0 pg   MCHC 33.2 30.0 - 36.0 g/dL   RDW 04.8 88.9 - 16.9 %   Platelets 346 150 - 400 K/uL   nRBC 0.0 0.0 - 0.2 %  Urinalysis, Routine w reflex microscopic  Result Value Ref Range   Color, Urine YELLOW YELLOW   APPearance CLEAR CLEAR   Specific Gravity, Urine 1.028 1.005 - 1.030   pH 5.0 5.0 - 8.0   Glucose, UA NEGATIVE NEGATIVE mg/dL   Hgb urine dipstick MODERATE (A) NEGATIVE   Bilirubin Urine NEGATIVE NEGATIVE   Ketones, ur NEGATIVE NEGATIVE mg/dL   Protein, ur NEGATIVE NEGATIVE mg/dL   Nitrite NEGATIVE NEGATIVE   Leukocytes,Ua TRACE (A) NEGATIVE   RBC / HPF 0-5 0 - 5 RBC/hpf   WBC, UA 6-10 0 - 5  WBC/hpf   Bacteria, UA RARE (A) NONE SEEN   Squamous Epithelial / LPF 0-5 0 - 5   Mucus PRESENT    Hyaline Casts, UA PRESENT   I-Stat beta hCG blood, ED  Result Value Ref Range   I-stat hCG, quantitative <5.0 <5 mIU/mL   Comment 3           CT ABDOMEN PELVIS W CONTRAST  Result Date: 06/02/2021 CLINICAL DATA:  Left lower quadrant abdominal pain. EXAM: CT ABDOMEN AND PELVIS WITH CONTRAST TECHNIQUE: Multidetector CT imaging of the abdomen and pelvis was performed using the standard protocol following bolus administration of intravenous contrast. RADIATION DOSE REDUCTION: This exam was performed according to the departmental dose-optimization program which includes automated exposure control, adjustment of the mA and/or kV according to patient size and/or use of iterative reconstruction technique. CONTRAST:  06/04/2021 OMNIPAQUE  IOHEXOL 300 MG/ML  SOLN COMPARISON:  January 03, 2015 FINDINGS: Lower chest: No acute abnormality. Hepatobiliary: No focal liver abnormality is seen. No gallstones, gallbladder wall thickening, or biliary dilatation. Pancreas: Unremarkable. No pancreatic ductal dilatation or surrounding inflammatory changes. Spleen: Normal in size without focal abnormality. Adrenals/Urinary Tract: Adrenal glands are unremarkable. Kidneys are normal, without renal calculi, focal lesion, or hydronephrosis. Bladder is unremarkable. Stomach/Bowel: Stomach is within normal limits. Appendicolith within the proximal appendix, however no secondary signs of acute appendicitis. No evidence of bowel wall thickening, distention, or inflammatory changes. Vascular/Lymphatic: No significant vascular findings are present. No enlarged abdominal or pelvic lymph nodes. Reproductive: Uterus and bilateral adnexa are unremarkable. Other: No abdominal wall hernia or abnormality. No abdominopelvic ascites. Musculoskeletal: No acute or significant osseous findings. IMPRESSION: 1. No evidence of acute abnormalities within the  abdomen or pelvis. 2. Appendicolith within the proximal appendix, however no secondary signs of acute appendicitis. Electronically Signed   By: Ted Mcalpine M.D.   On: 06/02/2021 15:04       Antony Madura, PA-C 06/03/21 4098    Sabas Sous, MD 06/03/21 0700

## 2021-09-04 ENCOUNTER — Emergency Department (HOSPITAL_COMMUNITY)
Admission: EM | Admit: 2021-09-04 | Discharge: 2021-09-04 | Disposition: A | Discharge status: Home / Self Care | Payer: 59 | Attending: Emergency Medicine | Admitting: Emergency Medicine

## 2021-09-04 ENCOUNTER — Encounter (HOSPITAL_COMMUNITY): Payer: Self-pay | Admitting: Emergency Medicine

## 2021-09-04 DIAGNOSIS — R519 Headache, unspecified: Secondary | ICD-10-CM | POA: Diagnosis present

## 2021-09-04 DIAGNOSIS — G43909 Migraine, unspecified, not intractable, without status migrainosus: Secondary | ICD-10-CM | POA: Insufficient documentation

## 2021-09-04 MED ORDER — KETOROLAC TROMETHAMINE 15 MG/ML IJ SOLN
15.0000 mg | Freq: Once | INTRAMUSCULAR | Status: AC
  Administered 2021-09-04: 15 mg via INTRAVENOUS
  Filled 2021-09-04: qty 1

## 2021-09-04 MED ORDER — SUMATRIPTAN SUCCINATE 100 MG PO TABS: 100.0000 mg | ORAL_TABLET | ORAL | 0 refills | Status: AC | PRN

## 2021-09-04 MED ORDER — DIPHENHYDRAMINE HCL 50 MG/ML IJ SOLN
50.0000 mg | Freq: Once | INTRAMUSCULAR | Status: AC
  Administered 2021-09-04: 50 mg via INTRAVENOUS
  Filled 2021-09-04: qty 1

## 2021-09-04 MED ORDER — SODIUM CHLORIDE 0.9 % IV BOLUS
1000.0000 mL | Freq: Once | INTRAVENOUS | Status: AC
  Administered 2021-09-04: 1000 mL via INTRAVENOUS

## 2021-09-04 MED ORDER — METOCLOPRAMIDE HCL 5 MG/ML IJ SOLN
10.0000 mg | Freq: Once | INTRAMUSCULAR | Status: AC
  Administered 2021-09-04: 10 mg via INTRAVENOUS
  Filled 2021-09-04: qty 2

## 2021-09-04 NOTE — ED Triage Notes (Signed)
Per pt, states she has a migraine for 3 days-pain getting worse-OTC meds not working ?

## 2021-09-04 NOTE — ED Provider Triage Note (Signed)
Emergency Medicine Provider Triage Evaluation Note ? ?Jamie Avery , a 36 y.o. female  was evaluated in triage.  Pt complains of migraine headache that began 3 days ago. Hx of same since she was 9 however has never seen a neurologist. Does not have an appt with a PCP in August of this year. Taking OTC meds without relief. Also complaining of photophobia, nausea, dizziness. No vomiting. No blurry vision or double vision.  ? ?Review of Systems  ?Positive: + headache, photophobia, nausea, dizziness ?Negative: - vomiting, neck stiffness, rash ? ?Physical Exam  ?BP (!) 142/86 (BP Location: Left Arm)   Pulse 84   Temp 98.2 ?F (36.8 ?C) (Oral)   Resp 16   SpO2 98%  ?Gen:   Awake, no distress   ?Resp:  Normal effort  ?MSK:   Moves extremities without difficulty  ?Other:   ? ?Medical Decision Making  ?Medically screening exam initiated at 1:34 PM.  Appropriate orders placed.  Jamie Avery was informed that the remainder of the evaluation will be completed by another provider, this initial triage assessment does not replace that evaluation, and the importance of remaining in the ED until their evaluation is complete. ? ? ?  ?Jamie Maize, PA-C ?09/04/21 1335 ? ?

## 2021-09-04 NOTE — Discharge Instructions (Signed)
Please pick up medication and take as prescribed ? ?Follow up with either [facility] or [facility] for further evaluation of your migraine headaches ? ?Return to the ED for any new/worsening symptoms ?

## 2021-09-04 NOTE — ED Provider Notes (Signed)
?[facility]-EMERGENCY DEPT ?Provider Note ? ? ?CSN: [id number] ?Arrival date & time: 09/04/21  1250 ? ?  ? ?History ? ?Chief Complaint  ?Patient presents with  ? Migraine  ? ? ?Jamie Avery is a 36 y.o. female who presents to the ED today with complaint of gradual onset, constant, diffuse, migraine headache that began 3 days ago. Hx of same since she was 9 however has never seen a neurologist. Does not have an appt with a PCP until August of this year. Taking OTC meds without relief. Also complaining of photophobia, nausea, dizziness. No vomiting. No blurry vision or double vision. ? ?The history is provided by the patient and medical records.  ? ?  ? ?Home Medications ?Prior to Admission medications   ?Medication Sig Start Date End Date Taking? Authorizing Provider  ?SUMAtriptan (IMITREX) 100 MG tablet Take 1 tablet (100 mg total) by mouth as needed for migraine. May repeat in 2 hours if headache persists or recurs. Do not exceed more than 2 doses in 24 hours. 09/04/21  Yes Tanda Rockers, PA-C  ?FLUoxetine (PROZAC) 20 MG capsule Take 3 capsules (60 mg total) by mouth daily. ?Patient not taking: Reported on 06/03/2021 04/29/19   Aldean Baker, NP  ?FLUoxetine (PROZAC) 40 MG capsule Take 40 mg by mouth daily. 05/08/21   [provider]  ?lamoTRIgine (LAMICTAL) 100 MG tablet Take 1 tablet (100 mg total) by mouth daily. ?Patient taking differently: Take 100 mg by mouth 2 (two) times daily. 04/29/19   Aldean Baker, NP  ?ondansetron (ZOFRAN-ODT) 4 MG disintegrating tablet Take 4 mg by mouth every 8 (eight) hours as needed. 06/01/21   [provider]  ?   ? ?Allergies    ?Patient has no known allergies.   ? ?Review of Systems   ?Review of Systems  ?Constitutional:  Negative for chills and fever.  ?Eyes:  Positive for photophobia. Negative for visual disturbance.  ?Gastrointestinal:  Positive for nausea. Negative for vomiting.  ?Neurological:  Positive for headaches. Negative for  weakness and numbness.  ?All other systems reviewed and are negative. ? ?Physical Exam ?Updated Vital Signs ?BP (!) 157/93 (BP Location: Left Arm)   Pulse 76   Temp 98.2 ?F (36.8 ?C) (Oral)   Resp 18   SpO2 98%  ?Physical Exam ?Vitals and nursing note reviewed.  ?Constitutional:   ?   Appearance: She is not ill-appearing.  ?HENT:  ?   Head: Normocephalic and atraumatic.  ?Eyes:  ?   Conjunctiva/sclera: Conjunctivae normal.  ?Cardiovascular:  ?   Rate and Rhythm: Normal rate and regular rhythm.  ?Pulmonary:  ?   Effort: Pulmonary effort is normal.  ?   Breath sounds: Normal breath sounds.  ?Abdominal:  ?   Palpations: Abdomen is soft.  ?   Tenderness: There is no abdominal tenderness.  ?Musculoskeletal:  ?   Cervical back: Neck supple.  ?Skin: ?   General: Skin is warm and dry.  ?Neurological:  ?   Mental Status: She is alert.  ? ? ?ED Results / Procedures / Treatments   ?Labs ?(all labs ordered are listed, but only abnormal results are displayed) ?Labs Reviewed - No data to display ? ?EKG ?None ? ?Radiology ?No results found. ? ?Procedures ?Procedures  ? ? ?Medications Ordered in ED ?Medications  ?ketorolac (TORADOL) 15 MG/ML injection 15 mg (15 mg Intravenous Given 09/04/21 1854)  ?metoCLOPramide (REGLAN) injection 10 mg (10 mg Intravenous Given 09/04/21 1854)  ?diphenhydrAMINE (BENADRYL) injection 50 mg (  50 mg Intravenous Given 09/04/21 1853)  ?sodium chloride 0.9 % bolus 1,000 mL (0 mLs Intravenous Stopped 09/04/21 1934)  ? ? ?ED Course/ Medical Decision Making/ A&P ?  ?                        ?Medical Decision Making ?36 year old female who presents to the ED today with complaint of migraine headache for the past 3 days.  History of same and states this feels similar.  On arrival to the ED vitals are stable.  Patient appears to be in no acute distress.  She was medically screened by myself in the waiting room.  I do not feel like she required any labs at that time suspected she would need a headache cocktail for  relief.  We will plan to order headache cocktail and reevaluate.  Patient without any focal neurodeficits on exam today to warrant CT imaging.  She does have an appointment with PCP scheduled in August to establish care, recommend to keep appointment.  She may also benefit from neurology in the outpatient setting.  ? ?Reevaluation patient reports improvement in her symptoms.  Currently rating her headache a 1.5 out of 10.  Was 8/10 and she first arrived.  We will plan to discharge home at this time.  Discharge home with short course of Imitrex.  Patient instructed to follow-up with her PCP for further eval.  She is in agreement with plan and stable for discharge. ? ?Problems Addressed: ?Migraine without status migrainosus, not intractable, unspecified migraine type: acute illness or injury ? ?Risk ?Prescription drug management. ? ? ? ? ? ? ? ? ? ?Final Clinical Impression(s) / ED Diagnoses ?Final diagnoses:  ?Migraine without status migrainosus, not intractable, unspecified migraine type  ? ? ?Rx / DC Orders ?ED Discharge Orders   ? ?      Ordered  ?  SUMAtriptan (IMITREX) 100 MG tablet  As needed       ? 09/04/21 2014  ? ?  ?  ? ?  ? ? ? ?Discharge Instructions   ? ?  ?Please pick up medication and take as prescribed ? ?Follow up with either [facility] or [facility] for further evaluation of your migraine headaches ? ?Return to the ED for any new/worsening symptoms ? ? ? ? ?  ?Tanda Rockers, PA-C ?09/04/21 2015 ? ?  ?Linwood Dibbles, MD ?09/04/21 2339 ? ?

## 2021-09-07 ENCOUNTER — Encounter: Payer: Self-pay | Admitting: Neurology

## 2022-01-25 ENCOUNTER — Ambulatory Visit: Payer: 59 | Admitting: Neurology

## 2022-04-18 NOTE — Progress Notes (Deleted)
NEUROLOGY CONSULTATION NOTE  Jamie Avery MRN: [id number] DOB: Oct 25, 1985  Referring provider: Linwood Dibbles, MD (ED referral) Primary care provider: ***  Reason for consult:  migraines  Assessment/Plan:   ***   Subjective:  Jamie Avery is a 36 year old female with Bipolar disorder, depression and anxiety who presents for migraines.  History supplemented by ED note.  Onset:  *** Location:  *** Quality:  *** Intensity:  ***.  *** denies new headache, thunderclap headache or severe headache that wakes *** from sleep. Aura:  *** Prodrome:  *** Postdrome:  *** Associated symptoms:  ***.  *** denies associated unilateral numbness or weakness. Duration:  *** Frequency:  *** Frequency of abortive medication: *** Triggers:  *** Relieving factors:  *** Activity:  ***  Particularly severe in March and April in which she was seen in the ED twice.    Remote CT head and C-spine from 11/08/2015 personally reviewed were unremarkable.  Past NSAIDS/analgesics:  naproxen, tramadol, ketorlac IM Past abortive triptans:  *** Past abortive ergotamine:  *** Past muscle relaxants:  cyclobenzaprine, methocarbamol Past anti-emetic:  metoclopramide Past antihypertensive medications:  *** Past antidepressant medications:  citalopram Past anticonvulsant medications:  *** Past anti-CGRP:  *** Past vitamins/Herbal/Supplements:  *** Past antihistamines/decongestants:  *** Other past therapies:  ***  Current NSAIDS/analgesics:  *** Current triptans:  sumatriptan 100mg  Current ergotamine:  none Current anti-emetic:  ondansetron ODT 4mg  Current muscle relaxants:  none Current Antihypertensive medications:  none Current Antidepressant medications:  fluoxetine 40mg  daily Current Anticonvulsant medications:  lamotrigine 100mg  BID Current anti-CGRP:  none Current Vitamins/Herbal/Supplements:  none Current Antihistamines/Decongestants:  none Other therapy:  *** Birth control:   none    Caffeine:  *** Alcohol:  *** Smoker:  *** Diet:  *** Exercise:  *** Depression:  ***; Anxiety:  *** Other pain:  *** Sleep hygiene:  *** Family history of headache:  ***      PAST MEDICAL HISTORY: Past Medical History:  Diagnosis Date   Anxiety    Depression     PAST SURGICAL HISTORY: Past Surgical History:  Procedure Laterality Date   ADENOIDECTOMY      MEDICATIONS: Current Outpatient Medications on File Prior to Visit  Medication Sig Dispense Refill   FLUoxetine (PROZAC) 20 MG capsule Take 3 capsules (60 mg total) by mouth daily. (Patient not taking: Reported on 06/03/2021) 90 capsule 0   FLUoxetine (PROZAC) 40 MG capsule Take 40 mg by mouth daily.     lamoTRIgine (LAMICTAL) 100 MG tablet Take 1 tablet (100 mg total) by mouth daily. (Patient taking differently: Take 100 mg by mouth 2 (two) times daily.) 30 tablet 0   ondansetron (ZOFRAN-ODT) 4 MG disintegrating tablet Take 4 mg by mouth every 8 (eight) hours as needed.     SUMAtriptan (IMITREX) 100 MG tablet Take 1 tablet (100 mg total) by mouth as needed for migraine. May repeat in 2 hours if headache persists or recurs. Do not exceed more than 2 doses in 24 hours. 10 tablet 0   No current facility-administered medications on file prior to visit.    ALLERGIES: No Known Allergies  FAMILY HISTORY: Family History  Family history unknown: Yes    Objective:  *** General: No acute distress.  Patient appears well-groomed.   Head:  Normocephalic/atraumatic Eyes:  fundi examined but not visualized Neck: supple, no paraspinal tenderness, full range of motion Back: No paraspinal tenderness Heart: regular rate and rhythm Lungs: Clear to auscultation bilaterally. Vascular: No carotid bruits. Neurological Exam: Mental  status: alert and oriented to person, place, and time, speech fluent and not dysarthric, language intact. Cranial nerves: CN I: not tested CN II: pupils equal, round and reactive to light,  visual fields intact CN III, IV, VI:  full range of motion, no nystagmus, no ptosis CN V: facial sensation intact. CN VII: upper and lower face symmetric CN VIII: hearing intact CN IX, X: gag intact, uvula midline CN XI: sternocleidomastoid and trapezius muscles intact CN XII: tongue midline Bulk & Tone: normal, no fasciculations. Motor:  muscle strength 5/5 throughout Sensation:  Pinprick, temperature and vibratory sensation intact. Deep Tendon Reflexes:  2+ throughout,  toes downgoing.   Finger to nose testing:  Without dysmetria.   Heel to shin:  Without dysmetria.   Gait:  Normal station and stride.  Romberg negative.    Thank you for allowing me to take part in the care of this patient.  Shon Millet, DO  CC: ***

## 2022-04-22 ENCOUNTER — Encounter: Payer: Self-pay | Admitting: Neurology

## 2022-04-22 ENCOUNTER — Ambulatory Visit: Payer: 59 | Admitting: Neurology

## 2023-07-28 DIAGNOSIS — L400 Psoriasis vulgaris: Secondary | ICD-10-CM | POA: Diagnosis not present

## 2023-07-28 DIAGNOSIS — Z131 Encounter for screening for diabetes mellitus: Secondary | ICD-10-CM | POA: Diagnosis not present

## 2023-07-28 DIAGNOSIS — R5382 Chronic fatigue, unspecified: Secondary | ICD-10-CM | POA: Diagnosis not present

## 2023-07-28 DIAGNOSIS — R400 Somnolence: Secondary | ICD-10-CM | POA: Diagnosis not present

## 2023-07-28 DIAGNOSIS — G9332 Myalgic encephalomyelitis/chronic fatigue syndrome: Secondary | ICD-10-CM | POA: Diagnosis not present

## 2023-07-28 DIAGNOSIS — F3342 Major depressive disorder, recurrent, in full remission: Secondary | ICD-10-CM | POA: Diagnosis not present

## 2023-10-14 DIAGNOSIS — E538 Deficiency of other specified B group vitamins: Secondary | ICD-10-CM | POA: Diagnosis not present

## 2023-10-14 DIAGNOSIS — Z124 Encounter for screening for malignant neoplasm of cervix: Secondary | ICD-10-CM | POA: Diagnosis not present

## 2023-10-14 DIAGNOSIS — Z0000 Encounter for general adult medical examination without abnormal findings: Secondary | ICD-10-CM | POA: Diagnosis not present

## 2023-10-14 DIAGNOSIS — Z13220 Encounter for screening for lipoid disorders: Secondary | ICD-10-CM | POA: Diagnosis not present

## 2023-12-25 DIAGNOSIS — R030 Elevated blood-pressure reading, without diagnosis of hypertension: Secondary | ICD-10-CM | POA: Diagnosis not present

## 2023-12-25 DIAGNOSIS — R197 Diarrhea, unspecified: Secondary | ICD-10-CM | POA: Diagnosis not present

## 2023-12-25 DIAGNOSIS — R1084 Generalized abdominal pain: Secondary | ICD-10-CM | POA: Diagnosis not present

## 2024-01-23 DIAGNOSIS — G43009 Migraine without aura, not intractable, without status migrainosus: Secondary | ICD-10-CM | POA: Diagnosis not present

## 2024-03-08 DIAGNOSIS — I10 Essential (primary) hypertension: Secondary | ICD-10-CM | POA: Diagnosis not present

## 2024-03-08 DIAGNOSIS — G43009 Migraine without aura, not intractable, without status migrainosus: Secondary | ICD-10-CM | POA: Diagnosis not present

## 2024-03-08 DIAGNOSIS — E66812 Obesity, class 2: Secondary | ICD-10-CM | POA: Diagnosis not present

## 2024-03-08 DIAGNOSIS — Z6839 Body mass index (BMI) 39.0-39.9, adult: Secondary | ICD-10-CM | POA: Diagnosis not present

## 2024-05-05 DIAGNOSIS — G43009 Migraine without aura, not intractable, without status migrainosus: Secondary | ICD-10-CM | POA: Diagnosis not present

## 2024-05-05 DIAGNOSIS — Z6838 Body mass index (BMI) 38.0-38.9, adult: Secondary | ICD-10-CM | POA: Diagnosis not present

## 2024-05-05 DIAGNOSIS — I10 Essential (primary) hypertension: Secondary | ICD-10-CM | POA: Diagnosis not present
